# Patient Record
Sex: Female | Born: 1985 | Race: Black or African American | Hispanic: No | Marital: Single | Smoking: Current every day smoker
Health system: Southern US, Community
[De-identification: ages and names within clinical notes are randomized; demographics above are authoritative.]

## PROBLEM LIST (undated history)

## (undated) DIAGNOSIS — G43909 Migraine, unspecified, not intractable, without status migrainosus: Secondary | ICD-10-CM

## (undated) DIAGNOSIS — I10 Essential (primary) hypertension: Secondary | ICD-10-CM

## (undated) HISTORY — PX: ANKLE SURGERY: SHX546

## (undated) HISTORY — PX: OTHER SURGICAL HISTORY: SHX169

---

## 2004-05-06 ENCOUNTER — Other Ambulatory Visit: Payer: Self-pay

## 2004-07-30 ENCOUNTER — Emergency Department: Payer: Self-pay | Admitting: Emergency Medicine

## 2004-11-23 ENCOUNTER — Emergency Department: Payer: Self-pay | Admitting: Emergency Medicine

## 2005-02-01 ENCOUNTER — Emergency Department: Payer: Self-pay | Admitting: Unknown Physician Specialty

## 2005-12-11 ENCOUNTER — Emergency Department: Payer: Self-pay | Admitting: Emergency Medicine

## 2006-07-29 ENCOUNTER — Emergency Department: Payer: Self-pay | Admitting: Emergency Medicine

## 2007-01-20 ENCOUNTER — Emergency Department: Payer: Self-pay | Admitting: Emergency Medicine

## 2007-01-21 ENCOUNTER — Emergency Department: Payer: Self-pay

## 2007-05-20 ENCOUNTER — Emergency Department: Payer: Self-pay | Admitting: Emergency Medicine

## 2007-05-24 ENCOUNTER — Emergency Department: Payer: Self-pay | Admitting: Emergency Medicine

## 2007-06-10 ENCOUNTER — Emergency Department: Payer: Self-pay | Admitting: Internal Medicine

## 2007-06-10 ENCOUNTER — Emergency Department: Payer: Self-pay | Admitting: Emergency Medicine

## 2007-11-28 ENCOUNTER — Emergency Department: Payer: Self-pay | Admitting: Emergency Medicine

## 2008-04-19 ENCOUNTER — Emergency Department: Payer: Self-pay | Admitting: Emergency Medicine

## 2008-04-19 ENCOUNTER — Other Ambulatory Visit: Payer: Self-pay

## 2008-05-06 ENCOUNTER — Ambulatory Visit: Payer: Self-pay | Admitting: Family Medicine

## 2008-05-27 ENCOUNTER — Emergency Department: Payer: Self-pay | Admitting: Emergency Medicine

## 2008-07-17 ENCOUNTER — Other Ambulatory Visit: Payer: Self-pay

## 2008-07-17 ENCOUNTER — Emergency Department: Payer: Self-pay | Admitting: Emergency Medicine

## 2008-08-28 ENCOUNTER — Emergency Department: Payer: Self-pay | Admitting: Emergency Medicine

## 2008-09-02 ENCOUNTER — Emergency Department: Payer: Self-pay | Admitting: Internal Medicine

## 2008-11-12 ENCOUNTER — Emergency Department: Payer: Self-pay | Admitting: Emergency Medicine

## 2008-11-17 ENCOUNTER — Emergency Department: Payer: Self-pay | Admitting: Emergency Medicine

## 2009-02-03 ENCOUNTER — Emergency Department: Payer: Self-pay | Admitting: Emergency Medicine

## 2009-02-05 ENCOUNTER — Ambulatory Visit: Payer: Self-pay | Admitting: Family Medicine

## 2009-03-07 ENCOUNTER — Emergency Department: Payer: Self-pay | Admitting: Emergency Medicine

## 2009-04-17 ENCOUNTER — Emergency Department: Payer: Self-pay | Admitting: Emergency Medicine

## 2009-05-18 ENCOUNTER — Emergency Department: Payer: Self-pay | Admitting: Emergency Medicine

## 2009-07-22 ENCOUNTER — Emergency Department: Payer: Self-pay | Admitting: Emergency Medicine

## 2009-07-25 ENCOUNTER — Emergency Department: Payer: Self-pay | Admitting: Emergency Medicine

## 2009-07-31 ENCOUNTER — Ambulatory Visit: Payer: Self-pay | Admitting: Family Medicine

## 2009-10-09 IMAGING — CR DG CHEST 1V PORT
1 series · 1 of 1 positions shown · non-contrast
Comparison: none

REASON FOR EXAM: pain
COMMENTS:

PROCEDURE:     DXR - DXR PORTABLE CHEST SINGLE VIEW  - May 18, 2009  [DATE]
RESULT:     Comparison is made to the prior exam of 09/02/2008. The lung
fields remain clear. The heart, mediastinal and osseous structures show no
significant abnormalities.

[view not recorded]
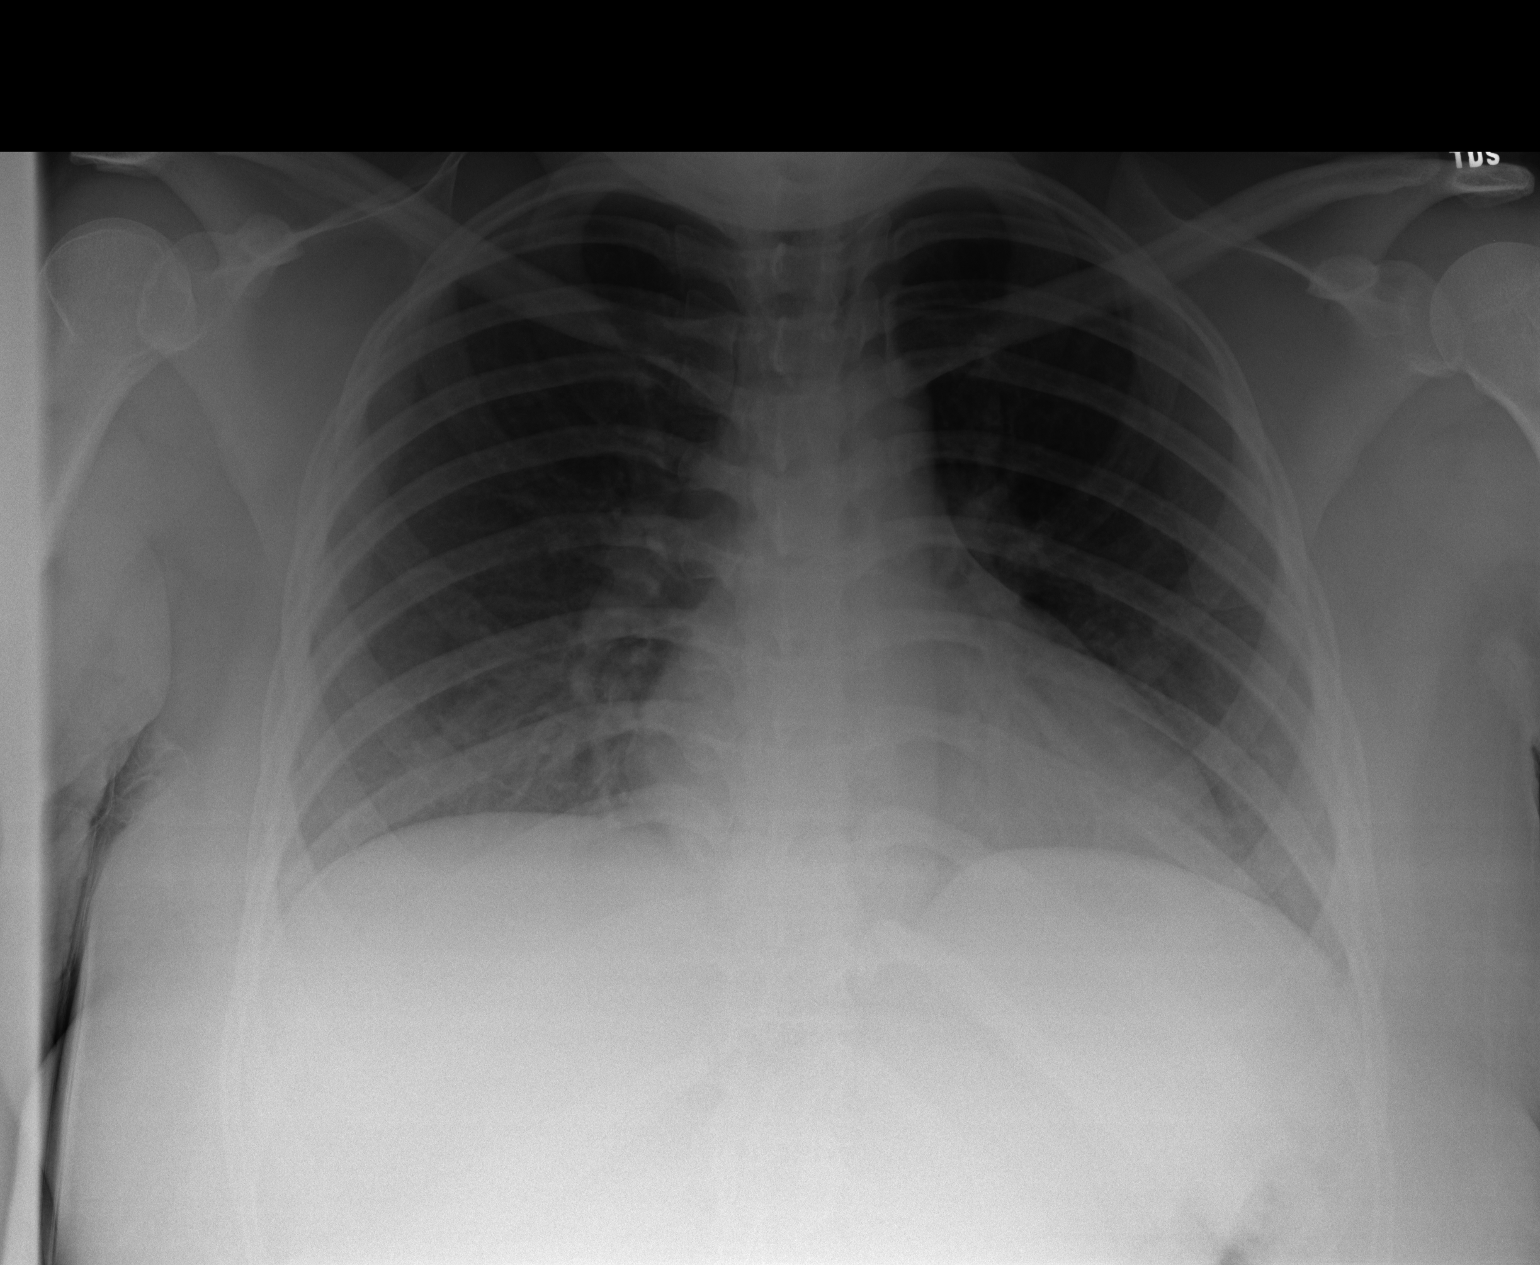

[1 of 1 positions shown; findings below may reference images not displayed]

IMPRESSION: 1.     No acute changes are identified.

## 2009-10-13 ENCOUNTER — Emergency Department: Payer: Self-pay | Admitting: Emergency Medicine

## 2010-01-09 ENCOUNTER — Emergency Department: Payer: Self-pay | Admitting: Emergency Medicine

## 2010-01-17 ENCOUNTER — Emergency Department: Payer: Self-pay | Admitting: Emergency Medicine

## 2010-04-08 ENCOUNTER — Emergency Department: Payer: Self-pay | Admitting: Emergency Medicine

## 2010-04-21 ENCOUNTER — Emergency Department: Payer: Self-pay | Admitting: Emergency Medicine

## 2010-06-20 ENCOUNTER — Emergency Department: Payer: Self-pay | Admitting: Emergency Medicine

## 2010-07-07 ENCOUNTER — Ambulatory Visit: Payer: Self-pay

## 2010-07-20 ENCOUNTER — Ambulatory Visit: Payer: Self-pay | Admitting: Unknown Physician Specialty

## 2010-07-21 ENCOUNTER — Emergency Department: Payer: Self-pay | Admitting: Emergency Medicine

## 2010-10-08 ENCOUNTER — Emergency Department: Payer: Self-pay | Admitting: Emergency Medicine

## 2011-02-15 ENCOUNTER — Emergency Department: Payer: Self-pay | Admitting: Emergency Medicine

## 2011-05-31 ENCOUNTER — Emergency Department: Payer: Self-pay | Admitting: Internal Medicine

## 2011-08-24 ENCOUNTER — Emergency Department: Payer: Self-pay | Admitting: Unknown Physician Specialty

## 2011-09-10 ENCOUNTER — Emergency Department: Payer: Self-pay | Admitting: Emergency Medicine

## 2011-10-08 ENCOUNTER — Emergency Department: Payer: Self-pay | Admitting: Emergency Medicine

## 2011-12-01 ENCOUNTER — Emergency Department: Payer: Self-pay | Admitting: Emergency Medicine

## 2011-12-06 ENCOUNTER — Emergency Department: Payer: Self-pay | Admitting: Unknown Physician Specialty

## 2011-12-06 LAB — COMPREHENSIVE METABOLIC PANEL
Albumin: 3.9 g/dL (ref 3.4–5.0)
Anion Gap: 11 (ref 7–16)
BUN: 9 mg/dL (ref 7–18)
Calcium, Total: 8.7 mg/dL (ref 8.5–10.1)
Chloride: 105 mmol/L (ref 98–107)
Co2: 26 mmol/L (ref 21–32)
EGFR (Non-African Amer.): >60
Glucose: 81 mg/dL (ref 65–99)
Osmolality: 281 (ref 275–301)
Potassium: 3.9 mmol/L (ref 3.5–5.1)
Sodium: 142 mmol/L (ref 136–145)
Total Protein: 7.6 g/dL (ref 6.4–8.2)

## 2011-12-06 LAB — URINALYSIS, COMPLETE
Bacteria: NONE SEEN
Glucose,UR: NEGATIVE mg/dL (ref 0–75)
Ketone: NEGATIVE
Leukocyte Esterase: NEGATIVE
Nitrite: NEGATIVE
Ph: 6 (ref 4.5–8.0)
Protein: NEGATIVE
RBC,UR: 1 /HPF (ref 0–5)
Specific Gravity: 1.02 (ref 1.003–1.030)

## 2011-12-06 LAB — DRUG SCREEN, URINE
Barbiturates, Ur Screen: NEGATIVE (ref ?–200)
Benzodiazepine, Ur Scrn: NEGATIVE (ref ?–200)
Cannabinoid 50 Ng, Ur ~~LOC~~: NEGATIVE (ref ?–50)
Cocaine Metabolite,Ur ~~LOC~~: NEGATIVE (ref ?–300)
MDMA (Ecstasy)Ur Screen: NEGATIVE (ref ?–500)
Opiate, Ur Screen: NEGATIVE (ref ?–300)
Phencyclidine (PCP) Ur S: NEGATIVE (ref ?–25)

## 2011-12-06 LAB — CBC
HCT: 42.6 % (ref 35.0–47.0)
MCH: 32.3 pg (ref 26.0–34.0)
MCHC: 33.5 g/dL (ref 32.0–36.0)
RDW: 13.1 % (ref 11.5–14.5)

## 2011-12-06 LAB — PREGNANCY, URINE: Pregnancy Test, Urine: NEGATIVE m[IU]/mL

## 2011-12-25 ENCOUNTER — Emergency Department: Payer: Self-pay | Admitting: Emergency Medicine

## 2011-12-25 LAB — URINALYSIS, COMPLETE
Bacteria: NONE SEEN
Glucose,UR: NEGATIVE mg/dL (ref 0–75)
Nitrite: NEGATIVE
Protein: NEGATIVE
RBC,UR: 4 /HPF (ref 0–5)
WBC UR: 15 /HPF (ref 0–5)

## 2012-02-10 ENCOUNTER — Emergency Department: Payer: Self-pay | Admitting: Emergency Medicine

## 2012-02-10 LAB — URINALYSIS, COMPLETE
Blood: NEGATIVE
Glucose,UR: NEGATIVE mg/dL (ref 0–75)
Ketone: NEGATIVE
Leukocyte Esterase: NEGATIVE
Nitrite: NEGATIVE
Ph: 5 (ref 4.5–8.0)
Protein: NEGATIVE
RBC,UR: NONE SEEN /HPF (ref 0–5)
Specific Gravity: 1.024 (ref 1.003–1.030)
Squamous Epithelial: 4

## 2012-02-23 ENCOUNTER — Ambulatory Visit: Payer: Self-pay | Admitting: Family Medicine

## 2012-03-02 ENCOUNTER — Emergency Department: Payer: Self-pay | Admitting: Emergency Medicine

## 2012-03-02 LAB — COMPREHENSIVE METABOLIC PANEL
Albumin: 3.9 g/dL (ref 3.4–5.0)
Alkaline Phosphatase: 81 U/L (ref 50–136)
Anion Gap: 9 (ref 7–16)
BUN: 10 mg/dL (ref 7–18)
Calcium, Total: 8.6 mg/dL (ref 8.5–10.1)
Chloride: 107 mmol/L (ref 98–107)
Co2: 25 mmol/L (ref 21–32)
Creatinine: 0.84 mg/dL (ref 0.60–1.30)
EGFR (African American): >60
Osmolality: 281 (ref 275–301)
Potassium: 3.7 mmol/L (ref 3.5–5.1)
SGOT(AST): 23 U/L (ref 15–37)
SGPT (ALT): 20 U/L

## 2012-03-02 LAB — URINALYSIS, COMPLETE
Bilirubin,UR: NEGATIVE
Glucose,UR: NEGATIVE mg/dL (ref 0–75)
Leukocyte Esterase: NEGATIVE
Nitrite: NEGATIVE
Protein: NEGATIVE
Specific Gravity: 1.029 (ref 1.003–1.030)
Squamous Epithelial: 5

## 2012-03-02 LAB — CBC
HCT: 44.3 % (ref 35.0–47.0)
MCH: 31.7 pg (ref 26.0–34.0)
MCHC: 32.7 g/dL (ref 32.0–36.0)
Platelet: 194 10*3/uL (ref 150–440)
RBC: 4.57 10*6/uL (ref 3.80–5.20)
WBC: 9.5 10*3/uL (ref 3.6–11.0)

## 2012-03-24 ENCOUNTER — Emergency Department: Payer: Self-pay | Admitting: Internal Medicine

## 2012-03-24 LAB — URINALYSIS, COMPLETE
Bacteria: NONE SEEN
Glucose,UR: NEGATIVE mg/dL (ref 0–75)
Ketone: NEGATIVE
Leukocyte Esterase: NEGATIVE
Nitrite: NEGATIVE
Specific Gravity: 1.013 (ref 1.003–1.030)
WBC UR: NONE SEEN /HPF (ref 0–5)

## 2012-07-02 ENCOUNTER — Emergency Department: Payer: Self-pay | Admitting: Emergency Medicine

## 2012-12-27 ENCOUNTER — Emergency Department: Payer: Self-pay | Admitting: Emergency Medicine

## 2012-12-27 LAB — URINALYSIS, COMPLETE
Bilirubin,UR: NEGATIVE
Blood: NEGATIVE
Glucose,UR: NEGATIVE mg/dL (ref 0–75)
Ketone: NEGATIVE
Nitrite: NEGATIVE
Protein: NEGATIVE
RBC,UR: 3 /HPF (ref 0–5)
Specific Gravity: 1.026 (ref 1.003–1.030)
Squamous Epithelial: 4
WBC UR: 6 /HPF (ref 0–5)

## 2012-12-27 LAB — CBC
MCHC: 32.8 g/dL (ref 32.0–36.0)
Platelet: 182 10*3/uL (ref 150–440)

## 2012-12-27 LAB — COMPREHENSIVE METABOLIC PANEL
Alkaline Phosphatase: 90 U/L (ref 50–136)
Anion Gap: 5 — ABNORMAL LOW (ref 7–16)
BUN: 9 mg/dL (ref 7–18)
Calcium, Total: 8 mg/dL — ABNORMAL LOW (ref 8.5–10.1)
Co2: 26 mmol/L (ref 21–32)
Creatinine: 0.82 mg/dL (ref 0.60–1.30)
EGFR (African American): >60
Osmolality: 281 (ref 275–301)
Potassium: 3.8 mmol/L (ref 3.5–5.1)
SGPT (ALT): 24 U/L (ref 12–78)
Sodium: 142 mmol/L (ref 136–145)
Total Protein: 7.5 g/dL (ref 6.4–8.2)

## 2012-12-27 LAB — TROPONIN I: Troponin-I: <0.02 ng/mL

## 2012-12-27 LAB — LIPASE, BLOOD: Lipase: 147 U/L (ref 73–393)

## 2013-04-10 ENCOUNTER — Ambulatory Visit: Payer: Self-pay | Admitting: Pain Medicine

## 2013-04-18 ENCOUNTER — Other Ambulatory Visit: Payer: Self-pay | Admitting: Pain Medicine

## 2013-04-18 ENCOUNTER — Ambulatory Visit: Payer: Self-pay | Admitting: Pain Medicine

## 2013-04-18 LAB — MAGNESIUM: Magnesium: 2.2 mg/dL

## 2013-11-28 ENCOUNTER — Emergency Department: Payer: Self-pay | Admitting: Internal Medicine

## 2014-10-06 ENCOUNTER — Emergency Department: Payer: Self-pay | Admitting: Emergency Medicine

## 2014-10-08 DIAGNOSIS — S82842A Displaced bimalleolar fracture of left lower leg, initial encounter for closed fracture: Secondary | ICD-10-CM | POA: Insufficient documentation

## 2014-10-09 ENCOUNTER — Ambulatory Visit: Payer: Self-pay | Admitting: Orthopedic Surgery

## 2014-10-11 ENCOUNTER — Emergency Department: Payer: Self-pay | Admitting: Emergency Medicine

## 2014-10-11 LAB — BASIC METABOLIC PANEL
ANION GAP: 10 (ref 7–16)
BUN: 8 mg/dL (ref 7–18)
CHLORIDE: 107 mmol/L (ref 98–107)
CREATININE: 0.88 mg/dL (ref 0.60–1.30)
Calcium, Total: 8.1 mg/dL — ABNORMAL LOW (ref 8.5–10.1)
Co2: 23 mmol/L (ref 21–32)
EGFR (African American): >60
EGFR (Non-African Amer.): >60
Glucose: 76 mg/dL (ref 65–99)
Osmolality: 276 (ref 275–301)
Potassium: 4 mmol/L (ref 3.5–5.1)
Sodium: 140 mmol/L (ref 136–145)

## 2014-10-11 LAB — CBC WITH DIFFERENTIAL/PLATELET
BASOS PCT: 0.9 %
Basophil #: 0.2 10*3/uL — ABNORMAL HIGH (ref 0.0–0.1)
Eosinophil #: 0.2 10*3/uL (ref 0.0–0.7)
Eosinophil %: 1 %
HCT: 43.6 % (ref 35.0–47.0)
HGB: 14.6 g/dL (ref 12.0–16.0)
Lymphocyte #: 7.8 10*3/uL — ABNORMAL HIGH (ref 1.0–3.6)
Lymphocyte %: 42.1 %
MCH: 32.4 pg (ref 26.0–34.0)
MCHC: 33.5 g/dL (ref 32.0–36.0)
MCV: 97 fL (ref 80–100)
MONO ABS: 0.8 x10 3/mm (ref 0.2–0.9)
Monocyte %: 4.3 %
NEUTROS PCT: 51.7 %
Neutrophil #: 9.6 10*3/uL — ABNORMAL HIGH (ref 1.4–6.5)
Platelet: 229 10*3/uL (ref 150–440)
RBC: 4.52 10*6/uL (ref 3.80–5.20)
RDW: 14.3 % (ref 11.5–14.5)
WBC: 18.6 10*3/uL — ABNORMAL HIGH (ref 3.6–11.0)

## 2014-10-11 LAB — URINALYSIS, COMPLETE
BLOOD: NEGATIVE
Bacteria: NONE SEEN
Bilirubin,UR: NEGATIVE
Glucose,UR: NEGATIVE mg/dL (ref 0–75)
Ketone: NEGATIVE
Nitrite: NEGATIVE
PROTEIN: NEGATIVE
Ph: 6 (ref 4.5–8.0)
RBC,UR: 2 /HPF (ref 0–5)
SPECIFIC GRAVITY: 1.017 (ref 1.003–1.030)
Squamous Epithelial: 4
WBC UR: 2 /HPF (ref 0–5)

## 2014-10-13 LAB — URINE CULTURE

## 2015-02-04 NOTE — Op Note (Signed)
PATIENT NAME:  Rebekah DillsBOSWELL, Lenzi R MR#:  [id number] DATE OF BIRTH:  1986/03/05  DATE OF PROCEDURE:  10/09/2014  PREOPERATIVE DIAGNOSIS:  Left bimalleolar ankle fracture, displaced.   POSTOPERATIVE DIAGNOSIS:  Left bimalleolar ankle fracture, displaced.   PROCEDURE:  Open reduction and internal fixation, left bimalleolar ankle fracture.   ANESTHESIA:  General.   SURGEON:  Leitha SchullerMichael J. Samuell Knoble, MD   DESCRIPTION OF PROCEDURE:  The patient was brought to the operating room, and after adequate anesthesia was obtained, the left leg was prepped and draped in the usual sterile fashion with a bump underneath the left buttock to internally rotate the leg. After prepping and draping in the usual sterile fashion, appropriate patient identification and timeout procedures were completed. The tourniquet was raised to 300 mmHg. A lateral approach was made to the distal fibula. The incision was carried down through the skin and subcutaneous tissue. The fracture site was exposed and reduced with a reduction clamp. An anterior-to-posterior lag screw was inserted, first drilling with a 3.5 drill, then placing the soft tissue protector into the lag screw hole and drilling the posterior cortex with a 2.5 drill. This was measured and a cortical screw inserted. The clamp was removed, and the fracture alignment was maintained with good compression of the fracture site. A 6-hole one-third tubular plate was contoured to fit the distal fibula for neutralization with 3 proximal cortical screws and 2 distal fully-threaded cancellous screws. Mini C-arm was used to make sure the position of the hardware was appropriate and it was. Next, the wound was thoroughly irrigated and closed with 2-0 Vicryl subcutaneously and skin staples. Attention was then turned medially, where an anteromedial approach was made and the fracture site exposed, the joint irrigated, and there was no obvious debris within the joint. The fracture was held in a reduced  position with a dental pick, and a single guidewire was inserted perpendicular to the fracture line from the 4.0 partially threaded Synthes cannulated screw set. The cortex was drilled, and a 34 mm partially-threaded cancellous screw was inserted with good compression of the fracture. Under stress views, there was no opening of the mortise. The mortise appeared intact with appropriate syndesmosis. The wound was irrigated and the wound closed again with 2-0 Vicryl subcutaneously and skin staples. Xeroform, 4 x 4's, Webril, and a well-padded cast were applied. The tourniquet was let down.   TOURNIQUET TIME:  39 minutes at 300 mmHg.   COMPLICATIONS:  None.   SPECIMEN:  None.   IMPLANTS:  Synthes 3.5 mm small one-third tubular plate and screws, 4.0 cannulated screw medially.    ____________________________ Leitha SchullerMichael J. Rozella Servello, MD mjm:nb D: 10/09/2014 19:19:48 ET T: 10/09/2014 23:33:43 ET JOB#: [id number]  cc: Leitha SchullerMichael J. Yovany Clock, MD, <Dictator> Leitha SchullerMICHAEL J Locklan Canoy MD ELECTRONICALLY SIGNED 10/14/2014 7:23

## 2015-07-03 ENCOUNTER — Encounter: Payer: Self-pay | Admitting: Emergency Medicine

## 2015-07-03 ENCOUNTER — Emergency Department
Admission: EM | Admit: 2015-07-03 | Discharge: 2015-07-03 | Disposition: A | Discharge status: Home / Self Care | Payer: Medicaid Other | Attending: Emergency Medicine | Admitting: Emergency Medicine

## 2015-07-03 DIAGNOSIS — R51 Headache: Secondary | ICD-10-CM | POA: Insufficient documentation

## 2015-07-03 DIAGNOSIS — R519 Headache, unspecified: Secondary | ICD-10-CM

## 2015-07-03 DIAGNOSIS — R110 Nausea: Secondary | ICD-10-CM

## 2015-07-03 DIAGNOSIS — Z720 Tobacco use: Secondary | ICD-10-CM | POA: Insufficient documentation

## 2015-07-03 DIAGNOSIS — R112 Nausea with vomiting, unspecified: Secondary | ICD-10-CM | POA: Insufficient documentation

## 2015-07-03 HISTORY — DX: Migraine, unspecified, not intractable, without status migrainosus: G43.909

## 2015-07-03 LAB — CBC WITH DIFFERENTIAL/PLATELET
BASOS ABS: 0.1 10*3/uL (ref 0–0.1)
BASOS PCT: 1 %
EOS PCT: 1 %
Eosinophils Absolute: 0.1 10*3/uL (ref 0–0.7)
HCT: 46 % (ref 35.0–47.0)
Hemoglobin: 15.1 g/dL (ref 12.0–16.0)
Lymphocytes Relative: 28 %
Lymphs Abs: 2.7 10*3/uL (ref 1.0–3.6)
MCH: 30.9 pg (ref 26.0–34.0)
MCHC: 32.7 g/dL (ref 32.0–36.0)
MCV: 94.3 fL (ref 80.0–100.0)
MONO ABS: 0.4 10*3/uL (ref 0.2–0.9)
Monocytes Relative: 4 %
Neutro Abs: 6.5 10*3/uL (ref 1.4–6.5)
Neutrophils Relative %: 66 %
PLATELETS: 208 10*3/uL (ref 150–440)
RBC: 4.87 MIL/uL (ref 3.80–5.20)
RDW: 14.1 % (ref 11.5–14.5)
WBC: 9.8 10*3/uL (ref 3.6–11.0)

## 2015-07-03 LAB — COMPREHENSIVE METABOLIC PANEL
ALBUMIN: 4.3 g/dL (ref 3.5–5.0)
ALT: 14 U/L (ref 14–54)
AST: 22 U/L (ref 15–41)
Alkaline Phosphatase: 81 U/L (ref 38–126)
Anion gap: 9 (ref 5–15)
BUN: 11 mg/dL (ref 6–20)
CHLORIDE: 107 mmol/L (ref 101–111)
CO2: 22 mmol/L (ref 22–32)
Calcium: 8.7 mg/dL — ABNORMAL LOW (ref 8.9–10.3)
Creatinine, Ser: 0.9 mg/dL (ref 0.44–1.00)
GFR calc Af Amer: >60 mL/min (ref >60)
Glucose, Bld: 89 mg/dL (ref 65–99)
POTASSIUM: 4 mmol/L (ref 3.5–5.1)
Sodium: 138 mmol/L (ref 135–145)
Total Bilirubin: 0.4 mg/dL (ref 0.3–1.2)
Total Protein: 7.8 g/dL (ref 6.5–8.1)

## 2015-07-03 LAB — LIPASE, BLOOD: LIPASE: 27 U/L (ref 22–51)

## 2015-07-03 MED ORDER — PROCHLORPERAZINE EDISYLATE 5 MG/ML IJ SOLN
10.0000 mg | Freq: Four times a day (QID) | INTRAMUSCULAR | Status: DC | PRN
  Administered 2015-07-03: 10 mg via INTRAVENOUS
  Filled 2015-07-03: qty 2

## 2015-07-03 MED ORDER — PROCHLORPERAZINE MALEATE 10 MG PO TABS: 10.0000 mg | ORAL_TABLET | Freq: Four times a day (QID) | ORAL | Status: DC | PRN

## 2015-07-03 MED ORDER — SODIUM CHLORIDE 0.9 % IV BOLUS (SEPSIS)
1000.0000 mL | Freq: Once | INTRAVENOUS | Status: AC
Start: 2015-07-03 — End: 2015-07-03
  Administered 2015-07-03: 1000 mL via INTRAVENOUS

## 2015-07-03 NOTE — ED Provider Notes (Signed)
[facility] Emergency Department Provider Note    ____________________________________________  Time seen: 1205  I have reviewed the triage vital signs and the nursing notes.   HISTORY  Chief Complaint Headache   History limited by: Not Limited   HPI Rebekah Thomas is a 29 y.o. female who presents with multiple medical complaints. The patient states she has felt unwell for the past week. It has been constant. She states that she has felt weak over the past week. In addition she has had a headache. She states that it is global. Has been accompanied by some blurry vision. She states she has been checking her blood pressure at a pharmacy and it has been consistently elevated in the past week. Patient states that yesterday she started having some nausea and vomiting. No blood in the vomiting. No diarrhea. No fevers.  Past Medical History  Diagnosis Date  . Migraine     There are no active problems to display for this patient.   Past Surgical History  Procedure Laterality Date  . Ankle surgery      No current outpatient prescriptions on file.  Allergies Review of patient's allergies indicates not on file.  History reviewed. No pertinent family history.  Social History Social History  Substance Use Topics  . Smoking status: Current Every Day Smoker  . Smokeless tobacco: None  . Alcohol Use: None    Review of Systems  Constitutional: Negative for fever. Cardiovascular: Negative for chest pain. Respiratory: Negative for shortness of breath. Gastrointestinal: Negative for abdominal pain, vomiting and diarrhea. Genitourinary: Negative for dysuria. Musculoskeletal: Negative for back pain. Skin: Negative for rash. Neurological: Positive for headache. No focal weakness.  10-point ROS otherwise negative.  ____________________________________________   PHYSICAL EXAM:  VITAL SIGNS: ED Triage Vitals  Enc Vitals Group     BP 07/03/15 1009  146/78 mmHg     Pulse Rate 07/03/15 1009 88     Resp 07/03/15 1009 18     Temp 07/03/15 1009 98.6 F (37 C)     Temp Source 07/03/15 1009 Oral     SpO2 07/03/15 1009 98 %     Weight 07/03/15 1009 295 lb (133.811 kg)     Height 07/03/15 1009  (1.651 m)     Head Cir --      Peak Flow --      Pain Score 07/03/15 1009 8   Constitutional: Alert and oriented. Well appearing and in no distress. Eyes: Conjunctivae are normal. PERRL. Normal extraocular movements. ENT   Head: Normocephalic and atraumatic.   Nose: No congestion/rhinnorhea.   Mouth/Throat: Mucous membranes are moist.   Neck: No stridor. Hematological/Lymphatic/Immunilogical: No cervical lymphadenopathy. Cardiovascular: Normal rate, regular rhythm.  No murmurs, rubs, or gallops. Respiratory: Normal respiratory effort without tachypnea nor retractions. Breath sounds are clear and equal bilaterally. No wheezes/rales/rhonchi. Gastrointestinal: Soft and nontender. No distention. There is no CVA tenderness. Genitourinary: Deferred Musculoskeletal: Normal range of motion in all extremities. No joint effusions.  No lower extremity tenderness nor edema. Neurologic:  Normal speech and language. Face symmetric. Symmetrical bilateral palate elevation. Tongue midline. Strength 5 out of 5 in upper and lower extremities. Sensation grossly intact. No gross focal neurologic deficits are appreciated.  Skin:  Skin is warm, dry and intact. No rash noted. Psychiatric: Mood and affect are normal. Speech and behavior are normal. Patient exhibits appropriate insight and judgment.  ____________________________________________    LABS (pertinent positives/negatives)  Labs Reviewed  COMPREHENSIVE METABOLIC PANEL - Abnormal;  Notable for the following:    Calcium 8.7 (*)    All other components within normal limits  CBC WITH DIFFERENTIAL/PLATELET  LIPASE, BLOOD  URINALYSIS COMPLETEWITH MICROSCOPIC ([facility] ONLY)      ____________________________________________   EKG  None  ____________________________________________    RADIOLOGY  None  ____________________________________________   PROCEDURES  Procedure(s) performed: None  Critical Care performed: No  ____________________________________________   INITIAL IMPRESSION / ASSESSMENT AND PLAN / ED COURSE  Pertinent labs & imaging results that were available during my care of the patient were reviewed by me and considered in my medical decision making (see chart for details).  Patient here with headache and nausea. No focal neuro deficits on exam. Patient felt much better after medication and fluids. Desired to be discharged home.   ____________________________________________   FINAL CLINICAL IMPRESSION(S) / ED DIAGNOSES  Final diagnoses:  Headache, unspecified headache type  Nausea     Phineas Semen, MD 07/03/15 1409

## 2015-07-03 NOTE — Discharge Instructions (Signed)
Please seek medical attention for any high fevers, chest pain, shortness of breath, change in behavior, persistent vomiting, bloody stool or any other new or concerning symptoms. ° °General Headache Without Cause °A headache is pain or discomfort felt around the head or neck area. The specific cause of a headache may not be found. There are many causes and types of headaches. A few common ones are: °· Tension headaches. °· Migraine headaches. °· Cluster headaches. °· Chronic daily headaches. °HOME CARE INSTRUCTIONS  °· Keep all follow-up appointments with your caregiver or any specialist referral. °· Only take over-the-counter or prescription medicines for pain or discomfort as directed by your caregiver. °· Lie down in a dark, quiet room when you have a headache. °· Keep a headache journal to find out what may trigger your migraine headaches. For example, write down: °¨ What you eat and drink. °¨ How much sleep you get. °¨ Any change to your diet or medicines. °· Try massage or other relaxation techniques. °· Put ice packs or heat on the head and neck. Use these 3 to 4 times per day for 15 to 20 minutes each time, or as needed. °· Limit stress. °· Sit up straight, and do not tense your muscles. °· Quit smoking if you smoke. °· Limit alcohol use. °· Decrease the amount of caffeine you drink, or stop drinking caffeine. °· Eat and sleep on a regular schedule. °· Get 7 to 9 hours of sleep, or as recommended by your caregiver. °· Keep lights dim if bright lights bother you and make your headaches worse. °SEEK MEDICAL CARE IF:  °· You have problems with the medicines you were prescribed. °· Your medicines are not working. °· You have a change from the usual headache. °· You have nausea or vomiting. °SEEK IMMEDIATE MEDICAL CARE IF:  °· Your headache becomes severe. °· You have a fever. °· You have a stiff neck. °· You have loss of vision. °· You have muscular weakness or loss of muscle control. °· You start losing your  balance or have trouble walking. °· You feel faint or pass out. °· You have severe symptoms that are different from your first symptoms. °MAKE SURE YOU:  °· Understand these instructions. °· Will watch your condition. °· Will get help right away if you are not doing well or get worse. °Document Released: 09/26/2005 Document Revised: 12/19/2011 Document Reviewed: 10/12/2011 °[employer]® Patient Information ©2015 [employer]. This information is not intended to replace advice given to you by your health care provider. Make sure you discuss any questions you have with your health care provider. ° °

## 2015-07-03 NOTE — ED Notes (Signed)
Reports ha, nausea and htn x 1 wk.

## 2016-08-09 ENCOUNTER — Ambulatory Visit
Admission: RE | Admit: 2016-08-09 | Discharge: 2016-08-09 | Disposition: A | Discharge status: Home / Self Care | Payer: Medicaid Other | Source: Ambulatory Visit | Attending: Family Medicine | Admitting: Family Medicine

## 2016-08-09 ENCOUNTER — Other Ambulatory Visit: Payer: Self-pay | Admitting: Family Medicine

## 2016-08-09 DIAGNOSIS — M160 Bilateral primary osteoarthritis of hip: Secondary | ICD-10-CM | POA: Insufficient documentation

## 2016-08-09 DIAGNOSIS — M5136 Other intervertebral disc degeneration, lumbar region: Secondary | ICD-10-CM | POA: Insufficient documentation

## 2016-08-09 DIAGNOSIS — M25552 Pain in left hip: Secondary | ICD-10-CM

## 2016-08-14 ENCOUNTER — Encounter: Payer: Self-pay | Admitting: Emergency Medicine

## 2016-08-14 ENCOUNTER — Emergency Department
Admission: EM | Admit: 2016-08-14 | Discharge: 2016-08-14 | Disposition: A | Discharge status: Home / Self Care | Payer: Medicaid Other | Attending: Emergency Medicine | Admitting: Emergency Medicine

## 2016-08-14 DIAGNOSIS — M25551 Pain in right hip: Secondary | ICD-10-CM | POA: Insufficient documentation

## 2016-08-14 DIAGNOSIS — M545 Low back pain, unspecified: Secondary | ICD-10-CM

## 2016-08-14 DIAGNOSIS — M25552 Pain in left hip: Secondary | ICD-10-CM | POA: Insufficient documentation

## 2016-08-14 DIAGNOSIS — F17200 Nicotine dependence, unspecified, uncomplicated: Secondary | ICD-10-CM | POA: Insufficient documentation

## 2016-08-14 MED ORDER — MELOXICAM 15 MG PO TABS: 15.0000 mg | ORAL_TABLET | Freq: Every day | ORAL | 2 refills | Status: AC

## 2016-08-14 NOTE — ED Triage Notes (Signed)
Pt reports low back pain that shoots down both legs for approximately three weeks. Denies injury. Pt reports saw PCP and diagnosed with arthritis but not given any medications.

## 2016-08-14 NOTE — ED Notes (Signed)
Patient seen by [facility] on the 31st for low back pain. Diagnosed with "arthritis". Patient c/o bilateral low back pain with radiation into both hips. Denies trauma, denies urinary or bowel complications. Ambulates without difficulty.

## 2016-08-14 NOTE — ED Provider Notes (Signed)
[facility] Emergency Department Provider Note  ____________________________________________   First MD Initiated Contact with Patient 08/14/16 [phone]     (approximate)  I have reviewed the triage vital signs and the nursing notes.   HISTORY  Chief Complaint Back Pain   HPI Rebekah Thomas is a 30 y.o. female is here with complaint of low back pain. Patient states that this is been going on for approximately 3 weeks. She states that she was seen by her primary care at [facility] on October 31 and diagnosed with "arthritis". Patient states she has low back pain that radiates into both hips and is worse with walking or standing. She denies any trauma. She denies any incontinence of bowel or bladder. Patient continues to ambulate unassisted. Patient states that pain is worse in the mornings. She states her PCP after diagnosing her with arthritis did not give her a prescription for anything. She was offered steroids but declined. Patient has been taking Tylenol arthritis at home without any improvement.  Past Medical History:  Diagnosis Date  . Migraine     There are no active problems to display for this patient.   Past Surgical History:  Procedure Laterality Date  . ANKLE SURGERY    . knee surgery      Prior to Admission medications   Medication Sig Start Date End Date Taking? Authorizing Provider  meloxicam (MOBIC) 15 MG tablet Take 1 tablet (15 mg total) by mouth daily. 08/14/16 08/14/17  Tommi Rumpshonda L Summers, PA-C  prochlorperazine (COMPAZINE) 10 MG tablet Take 1 tablet (10 mg total) by mouth every 6 (six) hours as needed for nausea (headache). 07/03/15 07/02/16  Phineas SemenGraydon Goodman, MD    Allergies Dilaudid [hydromorphone]; Aspirin; Penicillins; and Sulfa antibiotics  No family history on file.  Social History Social History  Substance Use Topics  . Smoking status: Current Every Day Smoker  . Smokeless tobacco: Not on file  . Alcohol use Not on file      Review of Systems Constitutional: No fever/chills Eyes: No visual changes. ENT: No sore throat. Cardiovascular: Denies chest pain. Respiratory: Denies shortness of breath. Gastrointestinal: No abdominal pain.  No nausea, no vomiting.   Genitourinary: Negative for dysuria. Musculoskeletal: Positive for low back pain. Positive for bilateral hip pain. Skin: Negative for rash. Neurological: Negative for headaches, focal weakness or numbness.  10-point ROS otherwise negative.  ____________________________________________   PHYSICAL EXAM:  VITAL SIGNS: ED Triage Vitals [08/14/16 0727]  Enc Vitals Group     BP (!) 166/98     Pulse Rate 89     Resp 18     Temp 98 F (36.7 C)     Temp Source Oral     SpO2 98 %     Weight 258 lb (117 kg)     Height 5\' 5"  (1.651 m)     Head Circumference      Peak Flow      Pain Score 8     Pain Loc      Pain Edu?      Excl. in GC?     Constitutional: Alert and oriented. Well appearing and in no acute distress. Moderately obese. Eyes: Conjunctivae are normal. PERRL. EOMI. Head: Atraumatic. Nose: No congestion/rhinnorhea. Neck: No stridor.   Cardiovascular: Normal rate, regular rhythm. Grossly normal heart sounds.  Good peripheral circulation. Respiratory: Normal respiratory effort.  No retractions. Lungs CTAB. Gastrointestinal: Soft and nontender. No distention. No CVA tenderness. Musculoskeletal: Examination of the back there  is no gross deformity noted. There is point tenderness on palpation of the spinous processes L5-S1 area. There is some tenderness on palpation of the SI joints bilaterally. Range of motion of the lower extremities is without restriction. Straight leg raises are 90 bilaterally with back discomfort. Good muscle strength. Neurologic:  Normal speech and language. No gross focal neurologic deficits are appreciated. No gait instability. Reflexes 2+ bilaterally. Skin:  Skin is warm, dry and intact. No rash  noted. Psychiatric: Mood and affect are normal. Speech and behavior are normal.  ____________________________________________   LABS (all labs ordered are listed, but only abnormal results are displayed)  Labs Reviewed - No data to display  PROCEDURES  Procedure(s) performed: None  Procedures  Critical Care performed: No  ____________________________________________   INITIAL IMPRESSION / ASSESSMENT AND PLAN / ED COURSE  Pertinent labs & imaging results that were available during my care of the patient were reviewed by me and considered in my medical decision making (see chart for details).    Clinical Course    Patient states that she had a rash with aspirin but denies any respiratory difficulties. Patient has not tried Aleve or other anti-inflammatories. Patient is willing to try meloxicam 15 mg. She is aware that if she begins with rash to stop taking the medication immediately and take Benadryl. She is return to the emergency room if any severe respiratory difficulties. She is also to follow-up with her primary care next week if any continued problems.  ____________________________________________   FINAL CLINICAL IMPRESSION(S) / ED DIAGNOSES  Final diagnoses:  Acute midline low back pain without sciatica      NEW MEDICATIONS STARTED DURING THIS VISIT:  Discharge Medication List as of 08/14/2016  7:53 AM    START taking these medications   Details  meloxicam (MOBIC) 15 MG tablet Take 1 tablet (15 mg total) by mouth daily., Starting Sun 08/14/2016, Until Mon 08/14/2017, Print         Note:  This document was prepared using Dragon voice recognition software and may include unintentional dictation errors.    Tommi Rumpshonda L Summers, PA-C 08/14/16 1457    Minna AntisKevin Paduchowski, MD 08/14/16 1530

## 2016-09-26 ENCOUNTER — Encounter: Payer: Self-pay | Admitting: Emergency Medicine

## 2016-09-26 ENCOUNTER — Emergency Department
Admission: EM | Admit: 2016-09-26 | Discharge: 2016-09-26 | Disposition: A | Discharge status: Home / Self Care | Payer: Medicaid Other | Attending: Emergency Medicine | Admitting: Emergency Medicine

## 2016-09-26 DIAGNOSIS — J111 Influenza due to unidentified influenza virus with other respiratory manifestations: Secondary | ICD-10-CM | POA: Insufficient documentation

## 2016-09-26 DIAGNOSIS — I10 Essential (primary) hypertension: Secondary | ICD-10-CM | POA: Insufficient documentation

## 2016-09-26 DIAGNOSIS — F17200 Nicotine dependence, unspecified, uncomplicated: Secondary | ICD-10-CM | POA: Insufficient documentation

## 2016-09-26 DIAGNOSIS — Z79899 Other long term (current) drug therapy: Secondary | ICD-10-CM | POA: Insufficient documentation

## 2016-09-26 LAB — INFLUENZA PANEL BY PCR (TYPE A & B)
INFLAPCR: POSITIVE — AB
INFLBPCR: NEGATIVE

## 2016-09-26 MED ORDER — IBUPROFEN 800 MG PO TABS
800.0000 mg | ORAL_TABLET | Freq: Once | ORAL | Status: AC
  Administered 2016-09-26: 800 mg via ORAL
  Filled 2016-09-26: qty 1

## 2016-09-26 MED ORDER — OSELTAMIVIR PHOSPHATE 75 MG PO CAPS: 75.0000 mg | ORAL_CAPSULE | Freq: Two times a day (BID) | ORAL | 0 refills | Status: AC

## 2016-09-26 MED ORDER — TRAMADOL HCL 50 MG PO TABS: 50.0000 mg | ORAL_TABLET | Freq: Four times a day (QID) | ORAL | 0 refills | Status: AC | PRN

## 2016-09-26 MED ORDER — HYDROCOD POLST-CPM POLST ER 10-8 MG/5ML PO SUER
5.0000 mL | Freq: Once | ORAL | Status: AC
  Administered 2016-09-26: 5 mL via ORAL
  Filled 2016-09-26: qty 5

## 2016-09-26 MED ORDER — IBUPROFEN 800 MG PO TABS: 800.0000 mg | ORAL_TABLET | Freq: Three times a day (TID) | ORAL | 0 refills | Status: DC | PRN

## 2016-09-26 NOTE — ED Provider Notes (Signed)
[facility] Emergency Department Provider Note   ____________________________________________   First MD Initiated Contact with Patient 09/26/16 1747     (approximate)  I have reviewed the triage vital signs and the nursing notes.   HISTORY  Chief Complaint Generalized Body Aches    HPI Rebekah Thomas is a 30 y.o. female patient complaining of flulike symptoms for 2 days. Patient state body aches, sinus congestion, frontal headache, sore throat, and nonproductive cough. Patient state experiencing diarrhea but denies nausea vomiting. No palliative measures for these complaints. Patient states she has not taken a flu shot this season. Patient rates the pain as a 9/10. Patient described a pain as "achy".   Past Medical History:  Diagnosis Date  . Migraine     There are no active problems to display for this patient.   Past Surgical History:  Procedure Laterality Date  . ANKLE SURGERY    . knee surgery      Prior to Admission medications   Medication Sig Start Date End Date Taking? Authorizing Provider  ibuprofen (ADVIL,MOTRIN) 800 MG tablet Take 1 tablet (800 mg total) by mouth every 8 (eight) hours as needed for moderate pain. 09/26/16   Joni Reiningonald K Brityn Mastrogiovanni, PA-C  meloxicam (MOBIC) 15 MG tablet Take 1 tablet (15 mg total) by mouth daily. 08/14/16 08/14/17  Tommi Rumpshonda L Summers, PA-C  oseltamivir (TAMIFLU) 75 MG capsule Take 1 capsule (75 mg total) by mouth 2 (two) times daily. 09/26/16 10/01/16  Joni Reiningonald K Kaly Mcquary, PA-C  prochlorperazine (COMPAZINE) 10 MG tablet Take 1 tablet (10 mg total) by mouth every 6 (six) hours as needed for nausea (headache). 07/03/15 07/02/16  Phineas SemenGraydon Goodman, MD  traMADol (ULTRAM) 50 MG tablet Take 1 tablet (50 mg total) by mouth every 6 (six) hours as needed. 09/26/16 09/26/17  Joni Reiningonald K Lyrical Sowle, PA-C    Allergies Dilaudid [hydromorphone]; Aspirin; Penicillins; and Sulfa antibiotics  No family history on file.  Social  History Social History  Substance Use Topics  . Smoking status: Current Every Day Smoker  . Smokeless tobacco: Never Used  . Alcohol use Not on file    Review of Systems Constitutional: Chills and body aches.  Eyes: No visual changes. ENT: Nasal congestion, postnasal drainage, bilateral ear pressure, sore throat.  Cardiovascular: Denies chest pain. Respiratory: Denies shortness of breath. Gastrointestinal: No abdominal pain.  No nausea, no vomiting.  Diarrhea . Genitourinary: Negative for dysuria. Musculoskeletal: Negative for back pain. Skin: Negative for rash. Neurological: Positive for frontal headache but denies focal weakness or numbness.  Endocrine:Hypertension Hematological/Lymphatic: Allergic/Immunilogical: See medication list 10-point ROS otherwise negative.  ____________________________________________   PHYSICAL EXAM:  VITAL SIGNS: ED Triage Vitals  Enc Vitals Group     BP 09/26/16 1725 (!) 176/99     Pulse Rate 09/26/16 1725 94     Resp 09/26/16 1725 18     Temp 09/26/16 1725 98.3 F (36.8 C)     Temp Source 09/26/16 1725 Oral     SpO2 09/26/16 1725 100 %     Weight 09/26/16 1725 285 lb (129.3 kg)     Height 09/26/16 1725 5\' 5"  (1.651 m)     Head Circumference --      Peak Flow --      Pain Score 09/26/16 1726 9     Pain Loc --      Pain Edu? --      Excl. in GC? --     Constitutional: Alert and oriented. Well appearing and  in no acute distress. Morbid obesity Eyes: Conjunctivae are normal. PERRL. EOMI. Head: Atraumatic. Nose: Bilateral maxillary guarding edematous nasal turbinates.  Mouth/Throat: Mucous membranes are moist. Oropharynx erythematous with copious postnasal drainage Neck: No stridor.  No cervical spine tenderness to palpation. Hematological/Lymphatic/Immunilogical: No cervical lymphadenopathy. Cardiovascular: Normal rate, regular rhythm. Grossly normal heart sounds.  Good peripheral circulation. Elevated blood pressure. Respiratory:  Normal respiratory effort.  No retractions. Lungs CTAB. Gastrointestinal: Soft and nontender. No distention. No abdominal bruits. No CVA tenderness. Musculoskeletal: No lower extremity tenderness nor edema.  No joint effusions. Neurologic:  Normal speech and language. No gross focal neurologic deficits are appreciated. No gait instability. Skin:  Skin is warm, dry and intact. No rash noted. Psychiatric: Mood and affect are normal. Speech and behavior are normal.  ____________________________________________   LABS (all labs ordered are listed, but only abnormal results are displayed)  Labs Reviewed  INFLUENZA PANEL BY PCR (TYPE A & B, H1N1) - Abnormal; Notable for the following:       Result Value   Influenza A By PCR POSITIVE (*)    All other components within normal limits   ___Positive influenza _________________________________________  EKG   ____________________________________________  RADIOLOGY   ____________________________________________   PROCEDURES  Procedure(s) performed: None  Procedures  Critical Care performed: No  ____________________________________________   INITIAL IMPRESSION / ASSESSMENT AND PLAN / ED COURSE  Pertinent labs & imaging results that were available during my care of the patient were reviewed by me and considered in my medical decision making (see chart for details).  Influenza. Patient given discharge Instructions. Patient given a work note. Patient given prescription for Tamiflu, tramadol, and ibuprofen.  Clinical Course      ____________________________________________   FINAL CLINICAL IMPRESSION(S) / ED DIAGNOSES  Final diagnoses:  Influenza      NEW MEDICATIONS STARTED DURING THIS VISIT:  New Prescriptions   IBUPROFEN (ADVIL,MOTRIN) 800 MG TABLET    Take 1 tablet (800 mg total) by mouth every 8 (eight) hours as needed for moderate pain.   OSELTAMIVIR (TAMIFLU) 75 MG CAPSULE    Take 1 capsule (75 mg total) by  mouth 2 (two) times daily.   TRAMADOL (ULTRAM) 50 MG TABLET    Take 1 tablet (50 mg total) by mouth every 6 (six) hours as needed.     Note:  This document was prepared using Dragon voice recognition software and may include unintentional dictation errors.    Joni ReiningRonald K Lubna Stegeman, PA-C 09/26/16 1924    Sharman CheekPhillip Stafford, MD 09/27/16 [phone]

## 2016-09-26 NOTE — ED Triage Notes (Signed)
Pt with flu like sx since Saturday.

## 2017-11-19 ENCOUNTER — Other Ambulatory Visit: Payer: Self-pay

## 2017-11-19 ENCOUNTER — Emergency Department
Admission: EM | Admit: 2017-11-19 | Discharge: 2017-11-20 | Disposition: A | Discharge status: Home / Self Care | Payer: Medicaid Other | Attending: Emergency Medicine | Admitting: Emergency Medicine

## 2017-11-19 ENCOUNTER — Emergency Department: Payer: Medicaid Other

## 2017-11-19 DIAGNOSIS — R109 Unspecified abdominal pain: Secondary | ICD-10-CM

## 2017-11-19 DIAGNOSIS — R1011 Right upper quadrant pain: Secondary | ICD-10-CM | POA: Insufficient documentation

## 2017-11-19 DIAGNOSIS — F17200 Nicotine dependence, unspecified, uncomplicated: Secondary | ICD-10-CM | POA: Insufficient documentation

## 2017-11-19 LAB — CBC WITH DIFFERENTIAL/PLATELET
BASOS ABS: 0.2 10*3/uL — AB (ref 0–0.1)
Basophils Relative: 2 %
Eosinophils Absolute: 0.2 10*3/uL (ref 0–0.7)
Eosinophils Relative: 2 %
HEMATOCRIT: 42.3 % (ref 35.0–47.0)
Hemoglobin: 14.4 g/dL (ref 12.0–16.0)
LYMPHS PCT: 41 %
Lymphs Abs: 4.4 10*3/uL — ABNORMAL HIGH (ref 1.0–3.6)
MCH: 31.9 pg (ref 26.0–34.0)
MCHC: 34 g/dL (ref 32.0–36.0)
MCV: 93.9 fL (ref 80.0–100.0)
MONO ABS: 0.4 10*3/uL (ref 0.2–0.9)
Monocytes Relative: 4 %
NEUTROS ABS: 5.5 10*3/uL (ref 1.4–6.5)
Neutrophils Relative %: 51 %
Platelets: 216 10*3/uL (ref 150–440)
RBC: 4.51 MIL/uL (ref 3.80–5.20)
RDW: 13.6 % (ref 11.5–14.5)
WBC: 10.6 10*3/uL (ref 3.6–11.0)

## 2017-11-19 LAB — URINALYSIS, COMPLETE (UACMP) WITH MICROSCOPIC
Bacteria, UA: NONE SEEN
Bilirubin Urine: NEGATIVE
Glucose, UA: NEGATIVE mg/dL
Hgb urine dipstick: NEGATIVE
KETONES UR: 5 mg/dL — AB
Nitrite: NEGATIVE
PH: 6 (ref 5.0–8.0)
Protein, ur: NEGATIVE mg/dL
SPECIFIC GRAVITY, URINE: 1.031 — AB (ref 1.005–1.030)

## 2017-11-19 LAB — COMPREHENSIVE METABOLIC PANEL
ALK PHOS: 93 U/L (ref 38–126)
ALT: 17 U/L (ref 14–54)
AST: 21 U/L (ref 15–41)
Albumin: 3.8 g/dL (ref 3.5–5.0)
Anion gap: 10 (ref 5–15)
BILIRUBIN TOTAL: 0.4 mg/dL (ref 0.3–1.2)
BUN: 15 mg/dL (ref 6–20)
CO2: 24 mmol/L (ref 22–32)
CREATININE: 1.19 mg/dL — AB (ref 0.44–1.00)
Calcium: 8.8 mg/dL — ABNORMAL LOW (ref 8.9–10.3)
Chloride: 105 mmol/L (ref 101–111)
GFR calc Af Amer: >60 mL/min (ref >60)
GFR, EST NON AFRICAN AMERICAN: 59 mL/min — AB (ref >60)
Glucose, Bld: 128 mg/dL — ABNORMAL HIGH (ref 65–99)
Potassium: 3.4 mmol/L — ABNORMAL LOW (ref 3.5–5.1)
Sodium: 139 mmol/L (ref 135–145)
TOTAL PROTEIN: 7.4 g/dL (ref 6.5–8.1)

## 2017-11-19 LAB — LIPASE, BLOOD: LIPASE: 39 U/L (ref 11–51)

## 2017-11-19 LAB — POCT PREGNANCY, URINE: Preg Test, Ur: NEGATIVE

## 2017-11-19 MED ORDER — ONDANSETRON HCL 4 MG/2ML IJ SOLN
INTRAMUSCULAR | Status: AC
  Administered 2017-11-19: 4 mg via INTRAVENOUS
  Filled 2017-11-19: qty 2

## 2017-11-19 MED ORDER — HALOPERIDOL LACTATE 5 MG/ML IJ SOLN
5.0000 mg | Freq: Once | INTRAMUSCULAR | Status: AC
  Administered 2017-11-19: 5 mg via INTRAVENOUS
  Filled 2017-11-19: qty 1

## 2017-11-19 MED ORDER — GI COCKTAIL ~~LOC~~
30.0000 mL | Freq: Once | ORAL | Status: AC
  Administered 2017-11-19: 30 mL via ORAL
  Filled 2017-11-19: qty 30

## 2017-11-19 MED ORDER — DICYCLOMINE HCL 10 MG/ML IM SOLN
20.0000 mg | Freq: Once | INTRAMUSCULAR | Status: AC
  Administered 2017-11-19: 20 mg via INTRAMUSCULAR
  Filled 2017-11-19: qty 2

## 2017-11-19 MED ORDER — ONDANSETRON HCL 4 MG/2ML IJ SOLN
4.0000 mg | Freq: Once | INTRAMUSCULAR | Status: AC
  Administered 2017-11-19: 4 mg via INTRAVENOUS

## 2017-11-19 MED ORDER — DICYCLOMINE HCL 20 MG PO TABS: 20.0000 mg | ORAL_TABLET | Freq: Three times a day (TID) | ORAL | 0 refills | Status: DC | PRN

## 2017-11-19 NOTE — ED Provider Notes (Signed)
[facility] Emergency Department Provider Note   ____________________________________________   I have reviewed the triage vital signs and the nursing notes.   HISTORY  Chief Complaint Abdominal Pain   History limited by: Not Limited   HPI Rebekah Thomas is a 32 y.o. female who presents to the emergency department today because of abdominal pain. It is located in the right upper quadrant. It started four days ago. Initially it would come and go but it has been constant today. It is severe. It has been accompanied by chills. She states she has had similar pain in the past. States she was told she had gallbladder issues by her PCP but denies ever having imaging of her gallbladder performed. She had mother and sister who had gallstones.   Per medical record review patient has a history of migraine.  Past Medical History:  Diagnosis Date  . Migraine     There are no active problems to display for this patient.   Past Surgical History:  Procedure Laterality Date  . ANKLE SURGERY    . knee surgery      Prior to Admission medications   Medication Sig Start Date End Date Taking? Authorizing Provider  ibuprofen (ADVIL,MOTRIN) 800 MG tablet Take 1 tablet (800 mg total) by mouth every 8 (eight) hours as needed for moderate pain. 09/26/16   Joni ReiningSmith, Ronald K, PA-C  prochlorperazine (COMPAZINE) 10 MG tablet Take 1 tablet (10 mg total) by mouth every 6 (six) hours as needed for nausea (headache). 07/03/15 07/02/16  Phineas SemenGoodman, Alivea Gladson, MD    Allergies Dilaudid [hydromorphone]; Aspirin; Penicillins; and Sulfa antibiotics  No family history on file.  Social History Social History   Tobacco Use  . Smoking status: Current Every Day Smoker  . Smokeless tobacco: Never Used  Substance Use Topics  . Alcohol use: Not on file  . Drug use: Not on file    Review of Systems Constitutional: Positive for chills. Eyes: No visual changes. ENT: No sore  throat. Cardiovascular: Denies chest pain. Respiratory: Denies shortness of breath. Gastrointestinal: Positive for abdominal pain.  Genitourinary: Negative for dysuria. Musculoskeletal: Negative for back pain. Skin: Negative for rash. Neurological: Negative for headaches, focal weakness or numbness.  ____________________________________________   PHYSICAL EXAM:  VITAL SIGNS: ED Triage Vitals  Enc Vitals Group     BP 11/19/17 2141 (!) 158/91     Pulse Rate 11/19/17 2140 85     Resp 11/19/17 2140 18     Temp 11/19/17 2141 98.5 F (36.9 C)     Temp Source 11/19/17 2141 Oral     SpO2 11/19/17 2140 98 %     Weight 11/19/17 2140 295 lb (133.8 kg)     Height 11/19/17 2140 5\' 4"  (1.626 m)     Head Circumference --      Peak Flow --      Pain Score 11/19/17 2140 8   Constitutional: Alert and oriented. Well appearing and in no distress. Eyes: Conjunctivae are normal.  ENT   Head: Normocephalic and atraumatic.   Nose: No congestion/rhinnorhea.   Mouth/Throat: Mucous membranes are moist.   Neck: No stridor. Hematological/Lymphatic/Immunilogical: No cervical lymphadenopathy. Cardiovascular: Normal rate, regular rhythm.  No murmurs, rubs, or gallops.  Respiratory: Normal respiratory effort without tachypnea nor retractions. Breath sounds are clear and equal bilaterally. No wheezes/rales/rhonchi. Gastrointestinal: Soft and tender to palpation in the right upper quadrant.  Genitourinary: Deferred Musculoskeletal: Normal range of motion in all extremities. No lower extremity edema. Neurologic:  Normal speech and language. No gross focal neurologic deficits are appreciated.  Skin:  Skin is warm, dry and intact. No rash noted. Psychiatric: Mood and affect are normal. Speech and behavior are normal. Patient exhibits appropriate insight and judgment.  ____________________________________________    LABS (pertinent positives/negatives)  Lipase 39 Upreg negative CBC wbc  10.6, hgb 14.4, plt 216 CMP k 3.4, glu 128, cr 1.19 UA wnl ____________________________________________   EKG  I, Phineas Semen, attending physician, personally viewed and interpreted this EKG  EKG Time: 2332 Rate: 70 Rhythm: sinus rhythm Axis: right axis deviation Intervals: qtc 447 QRS: narrow ST changes: no st elevation Impression: abnormal ekg  ____________________________________________    RADIOLOGY  [place] RUQ WNL  ____________________________________________   PROCEDURES  Procedures  ____________________________________________   INITIAL IMPRESSION / ASSESSMENT AND PLAN / ED COURSE  Pertinent labs & imaging results that were available during my care of the patient were reviewed by me and considered in my medical decision making (see chart for details).  Patient presented to the emergency department today because of concerns for right upper quadrant abdominal pain.  On exam patient appears somewhat uncomfortable.  She states this been going on for the past 4 days has had similar pain in the past.  Differential would include gallbladder disease, gallstones, gastritis, hepatitis, pancreatitis amongst other etiologies.  Right upper ultrasound did not show any concerning gallbladder stones or wall thickening or pericholecystic fluid.  Blood work without any concerning leukocytosis, elevated lipase or hepatic function panel abnormalities. Will try to treat the patient symptomatically.   ____________________________________________   FINAL CLINICAL IMPRESSION(S) / ED DIAGNOSES  Final diagnoses:  RUQ pain     Note: This dictation was prepared with Dragon dictation. Any transcriptional errors that result from this process are unintentional     Phineas Semen, MD 11/19/17 2336

## 2017-11-19 NOTE — ED Triage Notes (Signed)
Pt with ruq pain for four days with nausea.

## 2017-11-19 NOTE — Discharge Instructions (Signed)
Please seek medical attention for any high fevers, chest pain, shortness of breath, change in behavior, persistent vomiting, bloody stool or any other new or concerning symptoms.  

## 2017-11-20 NOTE — ED Provider Notes (Signed)
-----------------------------------------   12:34 AM on 11/20/2017 -----------------------------------------   Blood pressure 134/84, pulse 73, temperature 98.5 F (36.9 C), temperature source Oral, resp. rate 17, height 5\' 4"  (1.626 m), weight 133.8 kg (295 lb), SpO2 98 %.  Assuming care from Dr. Derrill KayGoodman.  In short, Theo Dillsatoria R Riege is a 32 y.o. female with a chief complaint of Abdominal Pain .  Refer to the original H&P for additional details.  The current plan of care is to reassess the patient after the pain medication.     The patient's pain has improved to a 0 out of 10.  She will be discharged home to follow-up as instructed by Dr. Derrill KayGoodman.   Rebecka ApleyWebster, Sydnee Lamour P, MD 11/20/17 [phone]

## 2019-08-16 ENCOUNTER — Other Ambulatory Visit: Payer: Self-pay

## 2019-12-16 ENCOUNTER — Emergency Department
Admission: EM | Admit: 2019-12-16 | Discharge: 2019-12-16 | Disposition: A | Discharge status: Home / Self Care | Payer: Self-pay | Attending: Emergency Medicine | Admitting: Emergency Medicine

## 2019-12-16 ENCOUNTER — Encounter: Payer: Self-pay | Admitting: Emergency Medicine

## 2019-12-16 ENCOUNTER — Emergency Department: Payer: Self-pay

## 2019-12-16 ENCOUNTER — Other Ambulatory Visit: Payer: Self-pay

## 2019-12-16 DIAGNOSIS — I10 Essential (primary) hypertension: Secondary | ICD-10-CM

## 2019-12-16 DIAGNOSIS — J219 Acute bronchiolitis, unspecified: Secondary | ICD-10-CM

## 2019-12-16 DIAGNOSIS — F17210 Nicotine dependence, cigarettes, uncomplicated: Secondary | ICD-10-CM | POA: Insufficient documentation

## 2019-12-16 HISTORY — DX: Essential (primary) hypertension: I10

## 2019-12-16 MED ORDER — AZITHROMYCIN 250 MG PO TABS: ORAL_TABLET | ORAL | 0 refills | Status: DC

## 2019-12-16 MED ORDER — PREDNISONE 10 MG (21) PO TBPK: ORAL_TABLET | ORAL | 0 refills | Status: DC

## 2019-12-16 NOTE — ED Provider Notes (Signed)
[facility] Emergency Department Provider Note  ____________________________________________   First MD Initiated Contact with Patient 12/16/19 1052     (approximate)  I have reviewed the triage vital signs and the nursing notes.   HISTORY  Chief Complaint Cough    HPI Rebekah Thomas is a 34 y.o. female presents emergency department complaint of a cough for 2 weeks.  States she was tested for Covid on 12/11/2019 which was negative.  States she continues to call follow-up colored phlegm.  No fever or chills.  Still has a sense of taste and smell.  No vomiting or diarrhea.    Past Medical History:  Diagnosis Date  . Hypertension   . Migraine     There are no problems to display for this patient.   Past Surgical History:  Procedure Laterality Date  . ANKLE SURGERY    . knee surgery      Prior to Admission medications   Medication Sig Start Date End Date Taking? Authorizing Provider  azithromycin (ZITHROMAX Z-PAK) 250 MG tablet 2 pills today then 1 pill a day for 4 days 12/16/19   Sherrie Mustache, Roselyn Bering, PA-C  predniSONE (STERAPRED UNI-PAK 21 TAB) 10 MG (21) TBPK tablet Take 6 pills on day one then decrease by 1 pill each day 12/16/19   Faythe Ghee, PA-C    Allergies Dilaudid [hydromorphone], Penicillins, Sulfa antibiotics, and Aspirin  No family history on file.  Social History Social History   Tobacco Use  . Smoking status: Current Every Day Smoker    Packs/day: 0.50    Types: Cigarettes  . Smokeless tobacco: Never Used  Substance Use Topics  . Alcohol use: Never  . Drug use: Never    Review of Systems  Constitutional: No fever/chills Eyes: No visual changes. ENT: No sore throat. Respiratory: Positive for productive cough Cardiovascular: Denies chest pain Gastrointestinal: Denies abdominal pain Genitourinary: Negative for dysuria. Musculoskeletal: Negative for back pain. Skin: Negative for rash. Psychiatric: no mood changes,      ____________________________________________   PHYSICAL EXAM:  VITAL SIGNS: ED Triage Vitals  Enc Vitals Group     BP 12/16/19 1024 (!) 175/102     Pulse Rate 12/16/19 1023 78     Resp 12/16/19 1023 16     Temp 12/16/19 1023 98.7 F (37.1 C)     Temp Source 12/16/19 1023 Oral     SpO2 12/16/19 1023 99 %     Weight 12/16/19 1020 285 lb (129.3 kg)     Height 12/16/19 1020 5\' 5"  (1.651 m)     Head Circumference --      Peak Flow --      Pain Score 12/16/19 1020 6     Pain Loc --      Pain Edu? --      Excl. in GC? --     Constitutional: Alert and oriented. Well appearing and in no acute distress. Eyes: Conjunctivae are normal.  Head: Atraumatic. Nose: No congestion/rhinnorhea. Mouth/Throat: Mucous membranes are moist.   Neck:  supple no lymphadenopathy noted Cardiovascular: Normal rate, regular rhythm. Heart sounds are normal Respiratory: Normal respiratory effort.  No retractions, lungs c t a  GU: deferred Musculoskeletal: FROM all extremities, warm and well perfused Neurologic:  Normal speech and language.  Skin:  Skin is warm, dry and intact. No rash noted. Psychiatric: Mood and affect are normal. Speech and behavior are normal.  ____________________________________________   LABS (all labs ordered are listed, but only abnormal  results are displayed)  Labs Reviewed - No data to display ____________________________________________   ____________________________________________  RADIOLOGY  Chest x-ray is negative for pneumonia or Covid opacities  ____________________________________________   PROCEDURES  Procedure(s) performed: No  Procedures    ____________________________________________   INITIAL IMPRESSION / ASSESSMENT AND PLAN / ED COURSE  Pertinent labs & imaging results that were available during my care of the patient were reviewed by me and considered in my medical decision making (see chart for details).   Patient is a  34 year old female presents emergency department complaining of a cough for 2 weeks.  See HPI  Physical exam patient appears well.  Is afebrile.  O2 saturations 99% on room air.  Lungs are clear to all station.  Cough is wet  Chest x-ray is negative for pneumonia or Covid opacities.  Explained the findings to the patient.  She was given a prescription for Z-Pak and steroid pack.  She is to follow-up with her regular doctor if not improving in 3 days.  I did discuss fact that her heart is becoming enlarged due to her hypertension.  She states she does not always take blood pressure medication.  Explained her it is very important to take this medication daily.  Sequela was discussed with her.  She was discharged in stable condition with a work note for 2 days.    Rebekah Thomas was evaluated in Emergency Department on 12/16/2019 for the symptoms described in the history of present illness. She was evaluated in the context of the global COVID-19 pandemic, which necessitated consideration that the patient might be at risk for infection with the SARS-CoV-2 virus that causes COVID-19. Institutional protocols and algorithms that pertain to the evaluation of patients at risk for COVID-19 are in a state of rapid change based on information released by regulatory bodies including the CDC and federal and state organizations. These policies and algorithms were followed during the patient's care in the ED.   As part of my medical decision making, I reviewed the following data within the [place] notes reviewed and incorporated, Old chart reviewed, Radiograph reviewed chest x-ray is negative, Notes from prior ED visits and [place] Controlled Substance Database  ____________________________________________   FINAL CLINICAL IMPRESSION(S) / ED DIAGNOSES  Final diagnoses:  Acute bronchiolitis due to unspecified organism  Essential hypertension      NEW MEDICATIONS STARTED DURING THIS  VISIT:  New Prescriptions   AZITHROMYCIN (ZITHROMAX Z-PAK) 250 MG TABLET    2 pills today then 1 pill a day for 4 days   PREDNISONE (STERAPRED UNI-PAK 21 TAB) 10 MG (21) TBPK TABLET    Take 6 pills on day one then decrease by 1 pill each day     Note:  This document was prepared using Dragon voice recognition software and may include unintentional dictation errors.    Versie Starks, PA-C 12/16/19 1151    Earleen Newport, MD 12/16/19 [phone]

## 2019-12-16 NOTE — ED Notes (Signed)
See triage note  Presents with cough   States she has a sore throat after coughing   Also states the cough has been so hard that she has vomited   States she had fever last pm at home but is afebrile on arrival  States she had a negative swab on 03/03

## 2019-12-16 NOTE — Discharge Instructions (Signed)
Follow-up with your regular doctor if not improving in 3 days. Take the antibiotic as prescribed.  Take the Sterapred as prescribed. You may take over-the-counter Delsym for cough if needed. Take your blood pressure medication daily as your blood pressure is elevated without your medication.

## 2019-12-16 NOTE — ED Triage Notes (Signed)
Pt in via POV, reports ongoing cough, generalized body aches x 2 weeks. Reports negative Covid test.  Ambulatory to triage; NAD noted at this time.

## 2020-03-11 ENCOUNTER — Other Ambulatory Visit: Payer: Self-pay | Admitting: Orthopedic Surgery

## 2020-03-11 ENCOUNTER — Ambulatory Visit
Admission: RE | Admit: 2020-03-11 | Discharge: 2020-03-11 | Disposition: A | Discharge status: Home / Self Care | Payer: 59 | Source: Ambulatory Visit | Attending: Orthopedic Surgery | Admitting: Orthopedic Surgery

## 2020-03-11 ENCOUNTER — Other Ambulatory Visit: Payer: Self-pay

## 2020-03-11 DIAGNOSIS — M5416 Radiculopathy, lumbar region: Secondary | ICD-10-CM | POA: Diagnosis present

## 2020-06-19 ENCOUNTER — Encounter: Payer: Self-pay | Admitting: Emergency Medicine

## 2020-06-19 ENCOUNTER — Emergency Department
Admission: EM | Admit: 2020-06-19 | Discharge: 2020-06-19 | Disposition: A | Discharge status: Home / Self Care | Payer: 59 | Attending: Emergency Medicine | Admitting: Emergency Medicine

## 2020-06-19 ENCOUNTER — Other Ambulatory Visit: Payer: Self-pay

## 2020-06-19 ENCOUNTER — Emergency Department: Payer: 59

## 2020-06-19 DIAGNOSIS — F17210 Nicotine dependence, cigarettes, uncomplicated: Secondary | ICD-10-CM | POA: Insufficient documentation

## 2020-06-19 DIAGNOSIS — I10 Essential (primary) hypertension: Secondary | ICD-10-CM | POA: Insufficient documentation

## 2020-06-19 DIAGNOSIS — M79672 Pain in left foot: Secondary | ICD-10-CM

## 2020-06-19 LAB — URIC ACID: Uric Acid, Serum: 5.6 mg/dL (ref 2.5–7.1)

## 2020-06-19 NOTE — ED Notes (Signed)
See triage note. Pt stating her left foot has been hurting x4 days. Pt denies injury or hearing a pop. Pt stating it is swollen and hot to the touch.

## 2020-06-19 NOTE — ED Provider Notes (Signed)
[facility] Emergency Department Provider Note  ____________________________________________   First MD Initiated Contact with Patient 06/19/20 1419     (approximate)  I have reviewed the triage vital signs and the nursing notes.   HISTORY  Chief Complaint Foot Pain    HPI Rebekah Thomas is a 34 y.o. female presents emergency department complaining of left foot pain.  Foot has been hurting for about 4 days.  Was concerned it was either gout or an injury.  No known injury.  Patient does take a blood pressure medicine with a water pill.  No history of gout.  No numbness or tingling.  No other complaints.    Past Medical History:  Diagnosis Date  . Hypertension   . Migraine     There are no problems to display for this patient.   Past Surgical History:  Procedure Laterality Date  . ANKLE SURGERY    . knee surgery      Prior to Admission medications   Not on File    Allergies Dilaudid [hydromorphone], Penicillins, Sulfa antibiotics, and Aspirin  History reviewed. No pertinent family history.  Social History Social History   Tobacco Use  . Smoking status: Current Every Day Smoker    Packs/day: 0.50    Types: Cigarettes  . Smokeless tobacco: Never Used  Vaping Use  . Vaping Use: Never used  Substance Use Topics  . Alcohol use: Never  . Drug use: Never    Review of Systems  Constitutional: No fever/chills Eyes: No visual changes. ENT: No sore throat. Respiratory: Denies cough Cardiovascular: Denies chest pain Gastrointestinal: Denies abdominal pain Genitourinary: Negative for dysuria. Musculoskeletal: Negative for back pain.  Positive for left foot pain Skin: Negative for rash. Psychiatric: no mood changes,     ____________________________________________   PHYSICAL EXAM:  VITAL SIGNS: ED Triage Vitals  Enc Vitals Group     BP 06/19/20 1240 129/89     Pulse Rate 06/19/20 1240 92     Resp 06/19/20 1240 18      Temp 06/19/20 1240 98.7 F (37.1 C)     Temp Source 06/19/20 1240 Oral     SpO2 06/19/20 1240 100 %     Weight 06/19/20 1238 298 lb (135.2 kg)     Height 06/19/20 1238 5\' 4"  (1.626 m)     Head Circumference --      Peak Flow --      Pain Score 06/19/20 1237 9     Pain Loc --      Pain Edu? --      Excl. in GC? --     Constitutional: Alert and oriented. Well appearing and in no acute distress. Eyes: Conjunctivae are normal.  Head: Atraumatic. Nose: No congestion/rhinnorhea. Mouth/Throat: Mucous membranes are moist.   Neck:  supple no lymphadenopathy noted Cardiovascular: Normal rate, regular rhythm.  Respiratory: Normal respiratory effort.  No retractions,  GU: deferred Musculoskeletal: FROM all extremities, warm and well perfused, the dorsum of the left foot and plantar surface of the left foot are tender to palpation, no redness swelling typical of gout is noted.  Neurovascular is intact Neurologic:  Normal speech and language.  Skin:  Skin is warm, dry and intact. No rash noted. Psychiatric: Mood and affect are normal. Speech and behavior are normal.  ____________________________________________   LABS (all labs ordered are listed, but only abnormal results are displayed)  Labs Reviewed  URIC ACID   ____________________________________________   ____________________________________________  RADIOLOGY  08/19/20  of the left foot is negative for any acute abnormality  ____________________________________________   PROCEDURES  Procedure(s) performed: No  Procedures    ____________________________________________   INITIAL IMPRESSION / ASSESSMENT AND PLAN / ED COURSE  Pertinent labs & imaging results that were available during my care of the patient were reviewed by me and considered in my medical decision making (see chart for details).   Patient is a 34 year old female presents emergency department with left foot pain.  See HPI.  Physical exam is consistent  with more of an inflammatory process.  X-ray of the left foot does not show any bony abnormality, there is localized soft tissue swelling.  Uric acid to rule out gout  Uric acid is normal.  I did explain findings to the patient.  She is placed in wooden shoe.  Instructed to continue her meloxicam.  Ice the foot.  Follow-up podiatry if not better in 1 week.  She is discharged stable condition.     Rebekah Thomas was evaluated in Emergency Department on 06/19/2020 for the symptoms described in the history of present illness. She was evaluated in the context of the global COVID-19 pandemic, which necessitated consideration that the patient might be at risk for infection with the SARS-CoV-2 virus that causes COVID-19. Institutional protocols and algorithms that pertain to the evaluation of patients at risk for COVID-19 are in a state of rapid change based on information released by regulatory bodies including the CDC and federal and state organizations. These policies and algorithms were followed during the patient's care in the ED.    As part of my medical decision making, I reviewed the following data within the electronic MEDICAL RECORD NUMBER Nursing notes reviewed and incorporated, Labs reviewed , Old chart reviewed, Radiograph reviewed , Notes from prior ED visits and [place] Controlled Substance Database  ____________________________________________   FINAL CLINICAL IMPRESSION(S) / ED DIAGNOSES  Final diagnoses:  Foot pain, left      NEW MEDICATIONS STARTED DURING THIS VISIT:  Current Discharge Medication List       Note:  This document was prepared using Dragon voice recognition software and may include unintentional dictation errors.    Faythe Ghee, PA-C 06/19/20 1559    Arnaldo Natal, MD 06/23/20 [phone]

## 2020-06-19 NOTE — Discharge Instructions (Addendum)
Follow-up with [facility] clinic podiatry if not improving in 5 to 7 days.  Continue take your meloxicam.  Add Tylenol.  Elevate and ice.  Wear the wooden shoe for 1 to 2 weeks to see if you have improvement in foot pain.

## 2020-06-19 NOTE — ED Triage Notes (Signed)
Here for left foot pain, pt feels like pain in the bones. Denies injury. NAD. Unlabored.

## 2020-06-19 NOTE — ED Notes (Signed)
Two unsuccessful lab draw attempts. Lab was called. Provider aware.

## 2020-06-30 ENCOUNTER — Ambulatory Visit: Payer: Self-pay | Admitting: Podiatry

## 2020-08-23 ENCOUNTER — Emergency Department
Admission: EM | Admit: 2020-08-23 | Discharge: 2020-08-23 | Disposition: A | Discharge status: Home / Self Care | Payer: 59 | Attending: Emergency Medicine | Admitting: Emergency Medicine

## 2020-08-23 ENCOUNTER — Other Ambulatory Visit: Payer: Self-pay

## 2020-08-23 ENCOUNTER — Emergency Department: Payer: 59

## 2020-08-23 DIAGNOSIS — Z79899 Other long term (current) drug therapy: Secondary | ICD-10-CM | POA: Diagnosis not present

## 2020-08-23 DIAGNOSIS — I10 Essential (primary) hypertension: Secondary | ICD-10-CM | POA: Diagnosis not present

## 2020-08-23 DIAGNOSIS — M79602 Pain in left arm: Secondary | ICD-10-CM | POA: Diagnosis not present

## 2020-08-23 DIAGNOSIS — R079 Chest pain, unspecified: Secondary | ICD-10-CM

## 2020-08-23 DIAGNOSIS — F17210 Nicotine dependence, cigarettes, uncomplicated: Secondary | ICD-10-CM | POA: Diagnosis not present

## 2020-08-23 DIAGNOSIS — E876 Hypokalemia: Secondary | ICD-10-CM | POA: Insufficient documentation

## 2020-08-23 LAB — CBC
HCT: 41.6 % (ref 36.0–46.0)
Hemoglobin: 13.6 g/dL (ref 12.0–15.0)
MCH: 31.3 pg (ref 26.0–34.0)
MCHC: 32.7 g/dL (ref 30.0–36.0)
MCV: 95.9 fL (ref 80.0–100.0)
Platelets: 254 10*3/uL (ref 150–400)
RBC: 4.34 MIL/uL (ref 3.87–5.11)
RDW: 14.3 % (ref 11.5–15.5)
WBC: 12 10*3/uL — ABNORMAL HIGH (ref 4.0–10.5)
nRBC: 0 % (ref 0.0–0.2)

## 2020-08-23 LAB — TROPONIN I (HIGH SENSITIVITY)
Troponin I (High Sensitivity): 5 ng/L (ref <18)
Troponin I (High Sensitivity): 5 ng/L (ref <18)

## 2020-08-23 LAB — BASIC METABOLIC PANEL
Anion gap: 13 (ref 5–15)
BUN: 11 mg/dL (ref 6–20)
CO2: 23 mmol/L (ref 22–32)
Calcium: 8.8 mg/dL — ABNORMAL LOW (ref 8.9–10.3)
Chloride: 104 mmol/L (ref 98–111)
Creatinine, Ser: 0.94 mg/dL (ref 0.44–1.00)
GFR, Estimated: >60 mL/min (ref >60)
Glucose, Bld: 91 mg/dL (ref 70–99)
Potassium: 2.9 mmol/L — ABNORMAL LOW (ref 3.5–5.1)
Sodium: 140 mmol/L (ref 135–145)

## 2020-08-23 MED ORDER — ORPHENADRINE CITRATE 30 MG/ML IJ SOLN
60.0000 mg | Freq: Once | INTRAMUSCULAR | Status: AC
  Administered 2020-08-23: 60 mg via INTRAMUSCULAR
  Filled 2020-08-23: qty 2

## 2020-08-23 MED ORDER — POTASSIUM CHLORIDE CRYS ER 20 MEQ PO TBCR
40.0000 meq | EXTENDED_RELEASE_TABLET | Freq: Once | ORAL | Status: AC
  Administered 2020-08-23: 40 meq via ORAL
  Filled 2020-08-23: qty 2

## 2020-08-23 MED ORDER — ORPHENADRINE CITRATE ER 100 MG PO TB12: 100.0000 mg | ORAL_TABLET | Freq: Two times a day (BID) | ORAL | 0 refills | Status: AC

## 2020-08-23 NOTE — Discharge Instructions (Signed)
Please follow up with the primary care provider.  Return to the ER for symptoms that change or worsen if unable to schedule an appointment.

## 2020-08-23 NOTE — ED Provider Notes (Signed)
[facility] Emergency Department Provider Note ____________________________________________   First MD Initiated Contact with Patient 08/23/20 1823     (approximate)  I have reviewed the triage vital signs and the nursing notes.   HISTORY  Chief Complaint Chest Pain  HPI Rebekah Thomas is a 34 y.o. female with history of migraines and hypertension presents to the emergency department for treatment and evaluation of left arm pain that radiates up into the left neck and intermittently into the left chest.  Pain started while sitting on the couch playing a game on her phone.  She has not had similar symptoms in the past.  She denies shortness of breath.  She denies diaphoresis.  EMS gave 2 nitroglycerin in route without relief of symptoms..     Past Medical History:  Diagnosis Date  . Hypertension   . Migraine     Patient Active Problem List   Diagnosis Date Noted  . Bimalleolar ankle fracture, left, closed, initial encounter 10/08/2014    Past Surgical History:  Procedure Laterality Date  . ANKLE SURGERY    . knee surgery      Prior to Admission medications   Medication Sig Start Date End Date Taking? Authorizing Provider  cyclobenzaprine (FLEXERIL) 10 MG tablet Take 10 mg by mouth every 8 (eight) hours as needed. 05/18/20   [provider]  DULoxetine (CYMBALTA) 30 MG capsule Take 30 mg by mouth at bedtime. 06/03/20   [provider]  hydrochlorothiazide (HYDRODIURIL) 25 MG tablet Take 25 mg by mouth daily. 06/03/20   [provider]  HYDROcodone-acetaminophen (NORCO/VICODIN) 5-325 MG tablet Take 1 tablet by mouth at bedtime as needed. 05/18/20   [provider]  methylPREDNISolone (MEDROL DOSEPAK) 4 MG TBPK tablet Take by mouth as directed. 03/10/20   [provider]  orphenadrine (NORFLEX) 100 MG tablet Take 1 tablet (100 mg total) by mouth 2 (two) times daily. 08/23/20 08/23/21  Robi Mitter, Kasandra Knudsen, FNP   promethazine (PHENERGAN) 12.5 MG tablet Take 12.5 mg by mouth every 6 (six) hours as needed. 02/18/20   [provider]    Allergies Dilaudid [hydromorphone], Eggs or egg-derived products, Penicillins, Sulfa antibiotics, Tramadol, Amoxicillin-pot clavulanate, Aspirin, Cefdinir, Cheese, Ibuprofen, Lac bovis, Magnesium citrate, Meloxicam, Prednisone, and Pregabalin  History reviewed. No pertinent family history.  Social History Social History   Tobacco Use  . Smoking status: Current Every Day Smoker    Packs/day: 0.50    Types: Cigarettes  . Smokeless tobacco: Never Used  Vaping Use  . Vaping Use: Never used  Substance Use Topics  . Alcohol use: Never  . Drug use: Never    Review of Systems  Constitutional: No fever/chills Eyes: No visual changes. ENT: No sore throat. Cardiovascular: Positive for chest pain. Respiratory: Denies shortness of breath. Gastrointestinal: No abdominal pain.  No nausea, no vomiting.  No diarrhea.  No constipation. Genitourinary: Negative for dysuria. Musculoskeletal: Positive for left arm pain. Skin: Negative for rash. Neurological: Negative for headaches, focal weakness or numbness. ___________________________________________   PHYSICAL EXAM:  VITAL SIGNS: ED Triage Vitals  Enc Vitals Group     BP 08/23/20 1702 (!) 154/83     Pulse Rate 08/23/20 1702 89     Resp 08/23/20 1702 14     Temp 08/23/20 1702 98.5 F (36.9 C)     Temp Source 08/23/20 1702 Oral     SpO2 08/23/20 1702 100 %     Weight --      Height --  Head Circumference --      Peak Flow --      Pain Score 08/23/20 1703 10     Pain Loc --      Pain Edu? --      Excl. in GC? --     Constitutional: Alert and oriented. Well appearing and in no acute distress. Eyes: Conjunctivae are normal. PERRL. EOMI. Head: Atraumatic. Nose: No congestion/rhinnorhea. Mouth/Throat: Mucous membranes are moist.  Oropharynx non-erythematous. Neck: No stridor.    Hematological/Lymphatic/Immunilogical: No cervical lymphadenopathy. Cardiovascular: Normal rate, regular rhythm. Grossly normal heart sounds.  Good peripheral circulation. Respiratory: Normal respiratory effort.  No retractions. Lungs CTAB. Gastrointestinal: Soft and nontender. No distention. No abdominal bruits. No CVA tenderness. Genitourinary:  Musculoskeletal: No focal tenderness or decrease in ROM over the left arm.  Neurologic:  Normal speech and language. No gross focal neurologic deficits are appreciated. No gait instability. Skin:  Skin is warm, dry and intact. No rash noted. Psychiatric: Mood and affect are normal. Speech and behavior are normal.  ____________________________________________   LABS (all labs ordered are listed, but only abnormal results are displayed)  Labs Reviewed  BASIC METABOLIC PANEL - Abnormal; Notable for the following components:      Result Value   Potassium 2.9 (*)    Calcium 8.8 (*)    All other components within normal limits  CBC - Abnormal; Notable for the following components:   WBC 12.0 (*)    All other components within normal limits  TROPONIN I (HIGH SENSITIVITY)  TROPONIN I (HIGH SENSITIVITY)   ____________________________________________  EKG  ED ECG REPORT I, Constant Mandeville, FNP-BC personally viewed and interpreted this ECG.   Date: 08/23/2020  EKG Time: 1648  Rate: 88  Rhythm: normal EKG, normal sinus rhythm  Axis: left axis deviation  Intervals:none  ST&T Change: no ST elevation   ____________________________________________  RADIOLOGY  ED MD interpretation:      I, Kem Boroughs, personally viewed and evaluated these images (plain radiographs) as part of my medical decision making, as well as reviewing the written report by the radiologist.  Official radiology report(s): No results found.  ____________________________________________   PROCEDURES  Procedure(s) performed (including Critical  Care):  Procedures  ____________________________________________   INITIAL IMPRESSION / ASSESSMENT AND PLAN     34 year old female presenting to the emergency department for treatment and evaluation of chest pain and left arm pain.  See HPI for further details.  Plan will be to get serial cardiac enzymes.  She states that the pain starts in her arm, goes up into the left side of her neck and then intermittently is in the anterior left chest wall.  She has no cardiac history.  She takes Depo-Medrol every 3 months.  She does smoke.  No family history of cardiac death under age 69. She denies pain in the chest with deep inspiration.  She denies shortness of breath.  DIFFERENTIAL DIAGNOSIS  Musculoskeletal pain, cardiac event, cervical radiculopathy  ED COURSE  While here, patient was given Norflex with significant improvement in pain.  She states that she now feels that her symptoms were likely muscle spasms.  Plan will be to treat her with Norflex and have her take Tylenol if needed.  She is to schedule a follow-up appointment with her primary care provider for symptoms that change, worsen, or do not improve with medication.   ___________________________________________   FINAL CLINICAL IMPRESSION(S) / ED DIAGNOSES  Final diagnoses:  Hypokalemia  Nonspecific chest pain  Left arm pain  ED Discharge Orders         Ordered    orphenadrine (NORFLEX) 100 MG tablet  2 times daily        08/23/20 2022           Rebekah Thomas was evaluated in Emergency Department on 08/24/2020 for the symptoms described in the history of present illness. She was evaluated in the context of the global COVID-19 pandemic, which necessitated consideration that the patient might be at risk for infection with the SARS-CoV-2 virus that causes COVID-19. Institutional protocols and algorithms that pertain to the evaluation of patients at risk for COVID-19 are in a state of rapid change based on  information released by regulatory bodies including the CDC and federal and state organizations. These policies and algorithms were followed during the patient's care in the ED.   Note:  This document was prepared using Dragon voice recognition software and may include unintentional dictation errors.   Chinita Pester, FNP 08/24/20 2010    Arnaldo Natal, MD 08/27/20 1956

## 2020-08-23 NOTE — ED Triage Notes (Signed)
Pt presents via EMS c/o chest pain and left arm pain unrelieved by nitro x2 per EMS. EMS reports hypertensive. Pt tearful upon arrival. Denies SOB

## 2020-09-23 ENCOUNTER — Emergency Department
Admission: EM | Admit: 2020-09-23 | Discharge: 2020-09-23 | Disposition: A | Discharge status: Home / Self Care | Payer: 59 | Source: Home / Self Care | Attending: Emergency Medicine | Admitting: Emergency Medicine

## 2020-09-23 ENCOUNTER — Other Ambulatory Visit: Payer: Self-pay

## 2020-09-23 ENCOUNTER — Emergency Department: Payer: 59

## 2020-09-23 ENCOUNTER — Emergency Department
Admission: EM | Admit: 2020-09-23 | Discharge: 2020-09-23 | Disposition: A | Discharge status: Home / Self Care | Payer: 59 | Attending: Emergency Medicine | Admitting: Emergency Medicine

## 2020-09-23 ENCOUNTER — Encounter: Payer: Self-pay | Admitting: Emergency Medicine

## 2020-09-23 DIAGNOSIS — F17210 Nicotine dependence, cigarettes, uncomplicated: Secondary | ICD-10-CM | POA: Insufficient documentation

## 2020-09-23 DIAGNOSIS — R110 Nausea: Secondary | ICD-10-CM | POA: Insufficient documentation

## 2020-09-23 DIAGNOSIS — I10 Essential (primary) hypertension: Secondary | ICD-10-CM | POA: Insufficient documentation

## 2020-09-23 DIAGNOSIS — R42 Dizziness and giddiness: Secondary | ICD-10-CM | POA: Insufficient documentation

## 2020-09-23 DIAGNOSIS — F419 Anxiety disorder, unspecified: Secondary | ICD-10-CM | POA: Insufficient documentation

## 2020-09-23 DIAGNOSIS — R61 Generalized hyperhidrosis: Secondary | ICD-10-CM | POA: Diagnosis not present

## 2020-09-23 DIAGNOSIS — R0602 Shortness of breath: Secondary | ICD-10-CM | POA: Insufficient documentation

## 2020-09-23 DIAGNOSIS — R319 Hematuria, unspecified: Secondary | ICD-10-CM

## 2020-09-23 DIAGNOSIS — R002 Palpitations: Secondary | ICD-10-CM | POA: Insufficient documentation

## 2020-09-23 DIAGNOSIS — Z79899 Other long term (current) drug therapy: Secondary | ICD-10-CM | POA: Insufficient documentation

## 2020-09-23 DIAGNOSIS — N390 Urinary tract infection, site not specified: Secondary | ICD-10-CM | POA: Insufficient documentation

## 2020-09-23 LAB — BASIC METABOLIC PANEL
Anion gap: 11 (ref 5–15)
Anion gap: 13 (ref 5–15)
BUN: 15 mg/dL (ref 6–20)
BUN: 10 mg/dL (ref 6–20)
CO2: 23 mmol/L (ref 22–32)
CO2: 21 mmol/L — ABNORMAL LOW (ref 22–32)
Calcium: 8.5 mg/dL — ABNORMAL LOW (ref 8.9–10.3)
Calcium: 8.4 mg/dL — ABNORMAL LOW (ref 8.9–10.3)
Chloride: 104 mmol/L (ref 98–111)
Chloride: 106 mmol/L (ref 98–111)
Creatinine, Ser: 0.95 mg/dL (ref 0.44–1.00)
Creatinine, Ser: 0.8 mg/dL (ref 0.44–1.00)
GFR, Estimated: >60 mL/min (ref >60)
GFR, Estimated: >60 mL/min (ref >60)
Glucose, Bld: 165 mg/dL — ABNORMAL HIGH (ref 70–99)
Glucose, Bld: 115 mg/dL — ABNORMAL HIGH (ref 70–99)
Potassium: 2.8 mmol/L — ABNORMAL LOW (ref 3.5–5.1)
Potassium: 3.4 mmol/L — ABNORMAL LOW (ref 3.5–5.1)
Sodium: 138 mmol/L (ref 135–145)
Sodium: 140 mmol/L (ref 135–145)

## 2020-09-23 LAB — TROPONIN I (HIGH SENSITIVITY)
Troponin I (High Sensitivity): 6 ng/L (ref <18)
Troponin I (High Sensitivity): 5 ng/L (ref <18)

## 2020-09-23 LAB — CBC
HCT: 40 % (ref 36.0–46.0)
HCT: 41.8 % (ref 36.0–46.0)
Hemoglobin: 13.2 g/dL (ref 12.0–15.0)
Hemoglobin: 13.9 g/dL (ref 12.0–15.0)
MCH: 31.5 pg (ref 26.0–34.0)
MCH: 31.7 pg (ref 26.0–34.0)
MCHC: 33 g/dL (ref 30.0–36.0)
MCHC: 33.3 g/dL (ref 30.0–36.0)
MCV: 95.5 fL (ref 80.0–100.0)
MCV: 95.4 fL (ref 80.0–100.0)
Platelets: 242 10*3/uL (ref 150–400)
Platelets: 243 10*3/uL (ref 150–400)
RBC: 4.19 MIL/uL (ref 3.87–5.11)
RBC: 4.38 MIL/uL (ref 3.87–5.11)
RDW: 14.2 % (ref 11.5–15.5)
RDW: 14.4 % (ref 11.5–15.5)
WBC: 10.7 10*3/uL — ABNORMAL HIGH (ref 4.0–10.5)
WBC: 13.6 10*3/uL — ABNORMAL HIGH (ref 4.0–10.5)
nRBC: 0 % (ref 0.0–0.2)
nRBC: 0 % (ref 0.0–0.2)

## 2020-09-23 LAB — URINALYSIS, COMPLETE (UACMP) WITH MICROSCOPIC
Bacteria, UA: NONE SEEN
Bilirubin Urine: NEGATIVE
Glucose, UA: NEGATIVE mg/dL
Ketones, ur: NEGATIVE mg/dL
Nitrite: NEGATIVE
Protein, ur: NEGATIVE mg/dL
Specific Gravity, Urine: 1.024 (ref 1.005–1.030)
pH: 5 (ref 5.0–8.0)

## 2020-09-23 LAB — POC URINE PREG, ED: Preg Test, Ur: NEGATIVE

## 2020-09-23 MED ORDER — POTASSIUM CHLORIDE ER 10 MEQ PO TBCR: 10.0000 meq | EXTENDED_RELEASE_TABLET | Freq: Every day | ORAL | 11 refills | Status: AC

## 2020-09-23 MED ORDER — LORAZEPAM 1 MG PO TABS: 1.0000 mg | ORAL_TABLET | Freq: Two times a day (BID) | ORAL | 0 refills | Status: AC

## 2020-09-23 MED ORDER — CIPROFLOXACIN HCL 500 MG PO TABS
500.0000 mg | ORAL_TABLET | Freq: Once | ORAL | Status: AC
  Administered 2020-09-23: 23:00:00 500 mg via ORAL
  Filled 2020-09-23: qty 1

## 2020-09-23 MED ORDER — FOSFOMYCIN TROMETHAMINE 3 G PO PACK: 3.0000 g | PACK | Freq: Once | ORAL | Status: DC

## 2020-09-23 MED ORDER — HYDROCHLOROTHIAZIDE 12.5 MG PO CAPS: 12.5000 mg | ORAL_CAPSULE | Freq: Every day | ORAL | Status: DC

## 2020-09-23 MED ORDER — HYDROCHLOROTHIAZIDE 12.5 MG PO CAPS: 12.5000 mg | ORAL_CAPSULE | Freq: Every day | ORAL | 11 refills | Status: AC

## 2020-09-23 MED ORDER — LORAZEPAM 1 MG PO TABS
1.0000 mg | ORAL_TABLET | Freq: Once | ORAL | Status: AC
  Administered 2020-09-23: 23:00:00 1 mg via ORAL
  Filled 2020-09-23: qty 1

## 2020-09-23 MED ORDER — POTASSIUM CHLORIDE 20 MEQ PO PACK
40.0000 meq | PACK | Freq: Once | ORAL | Status: AC
  Administered 2020-09-23: 23:00:00 40 meq via ORAL
  Filled 2020-09-23: qty 2

## 2020-09-23 MED ORDER — CIPROFLOXACIN HCL 500 MG PO TABS: 500.0000 mg | ORAL_TABLET | Freq: Two times a day (BID) | ORAL | 0 refills | Status: AC

## 2020-09-23 NOTE — ED Triage Notes (Addendum)
Pt arrived via POV with reports of feeling lightheaded and dizzy for the past week, pt reports hx of vertigo.  Pt states she awoken out of her sleep with her heart racing, sweating, difficulty catching her breath.  Pt denies any pain at this time. No distress noted at this time. Pt has been drinking a pepsi.  Pt reports having some shortness of breath as well.

## 2020-09-23 NOTE — Discharge Instructions (Addendum)
It sounds like you may be having little bit of anxiety.  I will give you some Ativan to take 1 twice a day this may help.  Longer to give you just a little bit of it.  If you have further problems you can follow-up with [facility] via may be on to give you some help.  You can also return here.  I have refilled your hydrochlorothiazide 1 a day.  Also since your potassium was low at 4 written you a prescription for potassium pills 1 a day.  The hydrochlorothiazide sometimes will decrease your potassium.  I would have you follow-up with your regular doctor and a couple weeks to make sure your potassium is doing okay.  Please return here if you have any further problems.  I have given you some Cipro here in the emergency room which should help with the UTI that you have.  I will give you a prescription for some more to start tomorrow if it does not get rid of the UTI symptoms the burning when you pee in the next couple days please return here or follow-up with your regular doctor.  Please return for fever vomiting.

## 2020-09-23 NOTE — ED Notes (Signed)
Pt reports feeling dizzy with emesis today.  Pt brought in via ems from home.  Pt was seen in the er last night with similar sx.  Pt denies SI or HI.  No drug or etoh use.  Pt in hallway bed.   Pt alert  Speech clear.

## 2020-09-23 NOTE — ED Triage Notes (Signed)
First RN Note: Pt to ED via [facility] with c/o panic attack/light headed. Per EMS pt seen last night for same. Per EMS pt began vomiting after being discharged and being told K 2.8 and possible UTI.   Per EMS pt took 2 hits inhaler PTA.   158/91 87HR 100% RA CBG 90

## 2020-09-23 NOTE — ED Notes (Signed)
Report off to stephen rn 

## 2020-09-23 NOTE — ED Provider Notes (Signed)
[facility] Emergency Department Provider Note   ____________________________________________   Event Date/Time   First MD Initiated Contact with Patient 09/23/20 2201     (approximate)  I have reviewed the triage vital signs and the nursing notes.   HISTORY  Chief Complaint Weakness    HPI Rebekah Thomas is a 34 y.o. female who reports that she has had some feelings of anxiety with some nausea and lightheadedness.  She came in last night for the same but the wait was so long that she left.  She is also had a little bit of burning with urination and she has run out of her hydrochlorothiazide.  She says she is having her house remodeled and all the worked its going on is probably what is causing her anxiety.  She does not have any belly pain or chest pain or shortness of breath.  Right now she feels okay just a little anxious.  Labs yesterday showed little UTI and negative pregnancy test.  Her potassium was low yesterday.  Potassium is better today.         Past Medical History:  Diagnosis Date  . Hypertension   . Migraine     Patient Active Problem List   Diagnosis Date Noted  . Bimalleolar ankle fracture, left, closed, initial encounter 10/08/2014    Past Surgical History:  Procedure Laterality Date  . ANKLE SURGERY    . knee surgery      Prior to Admission medications   Medication Sig Start Date End Date Taking? Authorizing Provider  ciprofloxacin (CIPRO) 500 MG tablet Take 1 tablet (500 mg total) by mouth 2 (two) times daily for 10 days. 09/23/20 10/03/20  Arnaldo Natal, MD  cyclobenzaprine (FLEXERIL) 10 MG tablet Take 10 mg by mouth every 8 (eight) hours as needed. 05/18/20   [provider]  DULoxetine (CYMBALTA) 30 MG capsule Take 30 mg by mouth at bedtime. 06/03/20   [provider]  hydrochlorothiazide (HYDRODIURIL) 25 MG tablet Take 25 mg by mouth daily. 06/03/20   [provider]  hydrochlorothiazide  (MICROZIDE) 12.5 MG capsule Take 1 capsule (12.5 mg total) by mouth daily. 09/23/20 09/23/21  Arnaldo Natal, MD  HYDROcodone-acetaminophen (NORCO/VICODIN) 5-325 MG tablet Take 1 tablet by mouth at bedtime as needed. 05/18/20   [provider]  LORazepam (ATIVAN) 1 MG tablet Take 1 tablet (1 mg total) by mouth 2 (two) times daily. 09/23/20 09/23/21  Arnaldo Natal, MD  methylPREDNISolone (MEDROL DOSEPAK) 4 MG TBPK tablet Take by mouth as directed. 03/10/20   [provider]  orphenadrine (NORFLEX) 100 MG tablet Take 1 tablet (100 mg total) by mouth 2 (two) times daily. 08/23/20 08/23/21  Triplett, Rulon Eisenmenger B, FNP  potassium chloride (KLOR-CON) 10 MEQ tablet Take 1 tablet (10 mEq total) by mouth daily. 09/23/20   Arnaldo Natal, MD  promethazine (PHENERGAN) 12.5 MG tablet Take 12.5 mg by mouth every 6 (six) hours as needed. 02/18/20   [provider]    Allergies Dilaudid [hydromorphone], Eggs or egg-derived products, Penicillins, Sulfa antibiotics, Tramadol, Amoxicillin-pot clavulanate, Aspirin, Cefdinir, Cheese, Ibuprofen, Lac bovis, Magnesium citrate, Meloxicam, Prednisone, and Pregabalin  No family history on file.  Social History Social History   Tobacco Use  . Smoking status: Current Every Day Smoker    Packs/day: 0.50    Types: Cigarettes  . Smokeless tobacco: Never Used  Vaping Use  . Vaping Use: Never used  Substance Use Topics  . Alcohol use: Never  .  Drug use: Never    Review of Systems  Constitutional: No fever/chills Eyes: No visual changes. ENT: No sore throat. Cardiovascular: Denies chest pain. Respiratory: Denies shortness of breath. Gastrointestinal: No abdominal pain.  No nausea, no vomiting.  No diarrhea.  No constipation. Genitourinary:  dysuria. Musculoskeletal: Negative for back pain. Skin: Negative for rash. Neurological: Negative for headaches, focal weakness  ____________________________________________   PHYSICAL EXAM:  VITAL  SIGNS: ED Triage Vitals  Enc Vitals Group     BP 09/23/20 1832 (!) 153/84     Pulse Rate 09/23/20 1831 83     Resp 09/23/20 1831 20     Temp 09/23/20 1831 99 F (37.2 C)     Temp Source 09/23/20 1831 Oral     SpO2 09/23/20 1831 100 %     Weight 09/23/20 1832 284 lb 6.3 oz (129 kg)     Height 09/23/20 1832 5\' 5"  (1.651 m)     Head Circumference --      Peak Flow --      Pain Score 09/23/20 1831 0     Pain Loc --      Pain Edu? --      Excl. in GC? --     Constitutional: Alert and oriented. Well appearing and in no acute distress. Eyes: Conjunctivae are normal.  Head: Atraumatic. Nose: No congestion/rhinnorhea. Mouth/Throat: Mucous membranes are moist.  Oropharynx non-erythematous. Neck: No stridor.  Cardiovascular: Normal rate, regular rhythm. Grossly normal heart sounds.  Good peripheral circulation. Respiratory: Normal respiratory effort.  No retractions. Lungs CTAB. Gastrointestinal: Soft and nontender. No distention. No abdominal bruits. No CVA tenderness. Musculoskeletal: No lower extremity tenderness nor edema. Neurologic:  Normal speech and language. No gross focal neurologic deficits are appreciated. Skin:  Skin is warm, dry and intact. No rash noted.   ____________________________________________   LABS (all labs ordered are listed, but only abnormal results are displayed)  Labs Reviewed  BASIC METABOLIC PANEL - Abnormal; Notable for the following components:      Result Value   Potassium 3.4 (*)    CO2 21 (*)    Glucose, Bld 115 (*)    Calcium 8.4 (*)    All other components within normal limits  URINALYSIS, COMPLETE (UACMP) WITH MICROSCOPIC  PREGNANCY, URINE   ____________________________________________  EKG  EKG read interpreted by me shows normal sinus rhythm rate of 86 rightward axis no acute ST-T wave changes possible limb lead reversal. __Repeat EKG shows termination of right axis looks like earlier EKG.   __________________________________________  RADIOLOGY 09/25/20, personally viewed and evaluated these images (plain radiographs) as part of my medical decision making, as well as reviewing the written report by the radiologist.  ED MD interpretation chest x-ray read by radiology reviewed by me is negative  Official radiology report(s): DG Chest 2 View  Result Date: 09/23/2020 CLINICAL DATA:  Shortness of breath EXAM: CHEST - 2 VIEW COMPARISON:  08/23/2020 FINDINGS: Heart and mediastinal contours are within normal limits. No focal opacities or effusions. No acute bony abnormality. IMPRESSION: No active cardiopulmonary disease. Electronically Signed   By: 08/25/2020 M.D.   On: 09/23/2020 03:12    ____________________________________________   PROCEDURES  Procedure(s) performed (including Critical Care):  Procedures   ____________________________________________   INITIAL IMPRESSION / ASSESSMENT AND PLAN / ED COURSE    Patient has some symptoms of anxiety.  She also has some dysuria with little bit of white count and some white cells in her urine.  I will treat her with a l short course of Ativan if she needs it but I will also give her some Cipro for her UTI she is allergic to almost everything else.  I am worried that fosfomycin might not be enough since she does have a white count and a low-grade fever of 99.  She has had some vomiting is tolerating p.o. fluids here in small sips but when she tried to drink the potassium she vomited that up again.  She did not vomit any of the other medications looking through the bag of vomit.  She wants to go home.  She has some Zofran at home.  She will drink fluids in small amounts frequently and promises to return if she cannot tolerate p.o. fluids or she runs a high fever or feel sicker at all.  She understands that the UTI could get worse and make her seriously ill.           ____________________________________________   FINAL CLINICAL IMPRESSION(S) / ED DIAGNOSES  Final diagnoses:  Anxiety  Urinary tract infection with hematuria, site unspecified     ED Discharge Orders         Ordered    LORazepam (ATIVAN) 1 MG tablet  2 times daily        09/23/20 2224    potassium chloride (KLOR-CON) 10 MEQ tablet  Daily        09/23/20 2224    hydrochlorothiazide (MICROZIDE) 12.5 MG capsule  Daily        09/23/20 2224    ciprofloxacin (CIPRO) 500 MG tablet  2 times daily        09/23/20 2306          *Please note:  Rebekah Thomas was evaluated in Emergency Department on 09/23/2020 for the symptoms described in the history of present illness. She was evaluated in the context of the global COVID-19 pandemic, which necessitated consideration that the patient might be at risk for infection with the SARS-CoV-2 virus that causes COVID-19. Institutional protocols and algorithms that pertain to the evaluation of patients at risk for COVID-19 are in a state of rapid change based on information released by regulatory bodies including the CDC and federal and state organizations. These policies and algorithms were followed during the patient's care in the ED.  Some ED evaluations and interventions may be delayed as a result of limited staffing during and the pandemic.*   Note:  This document was prepared using Dragon voice recognition software and may include unintentional dictation errors.    Arnaldo Natal, MD 09/23/20 [phone]

## 2020-09-23 NOTE — ED Notes (Signed)
Repeat ekg done

## 2020-09-23 NOTE — ED Triage Notes (Signed)
Pt to ED via EMS for chief complaint of nausea vomiting and dizziness x1 week.  Denies pain Seen for same yesterday, LWBS NAD noted

## 2020-11-01 ENCOUNTER — Ambulatory Visit
Admission: EM | Admit: 2020-11-01 | Discharge: 2020-11-01 | Disposition: A | Discharge status: Home / Self Care | Payer: 59

## 2020-11-01 ENCOUNTER — Encounter: Payer: Self-pay | Admitting: Emergency Medicine

## 2020-11-01 ENCOUNTER — Ambulatory Visit (INDEPENDENT_AMBULATORY_CARE_PROVIDER_SITE_OTHER): Payer: 59

## 2020-11-01 ENCOUNTER — Other Ambulatory Visit: Payer: Self-pay

## 2020-11-01 DIAGNOSIS — S8391XA Sprain of unspecified site of right knee, initial encounter: Secondary | ICD-10-CM

## 2020-11-01 DIAGNOSIS — M25361 Other instability, right knee: Secondary | ICD-10-CM | POA: Diagnosis not present

## 2020-11-01 DIAGNOSIS — M25561 Pain in right knee: Secondary | ICD-10-CM

## 2020-11-01 MED ORDER — HYDROCODONE-ACETAMINOPHEN 5-325 MG PO TABS
2.0000 | ORAL_TABLET | Freq: Four times a day (QID) | ORAL | 0 refills | Status: AC | PRN
Start: 2020-11-01 — End: 2020-11-06

## 2020-11-01 MED ORDER — NAPROXEN 500 MG PO TABS: 500.0000 mg | ORAL_TABLET | Freq: Two times a day (BID) | ORAL | 0 refills | Status: AC

## 2020-11-01 NOTE — Discharge Instructions (Signed)
SPRAIN: X-ray normal. Exam consistent with sprain or possibly torn meniscus. Stressed avoiding painful activities . Reviewed RICE guidelines. Use medications as directed, including NSAIDs. If no NSAIDs have been prescribed for you today, you may take Aleve or Motrin over the counter. May use Tylenol in between doses of NSAIDs.  If no improvement in the next 1-2 weeks, f/u with PCP or return to our office for reexamination, and please feel free to call or return at any time for any questions or concerns you may have and we will be happy to help you!      You have a condition requiring you to follow up with Orthopedics so please call one of the following office for appointment:   [facility] [address] [place], [place] [address] Phone: [phone]  [facility] [address] [place], [place] [address] Phone: [phone]

## 2020-11-01 NOTE — ED Triage Notes (Signed)
Patient in today c/o right knee pain x 3 days. Patient states her knee "gave out" and she fell. Patient has a history of meniscus tear and has had surgery on the right knee. Patient has been wearing a knee brace, taken OTC Ibuprofen and Tylenol and alternating heat and ice. Patient states she cannot straighten her right knee.

## 2020-11-01 NOTE — ED Triage Notes (Signed)
Patient did not receive a hinged knee brace. Did not have a large enough size. Patient will continue to wear the knee brace she has until she sees ortho. Patient agreed and verbalized understanding.

## 2020-11-03 NOTE — ED Provider Notes (Signed)
[facility]    CSN: [id number] Arrival date & time: 11/01/20  1416      History   Chief Complaint Chief Complaint  Patient presents with  . Knee Pain    right    HPI Rebekah Thomas is a 35 y.o. female presenting for 3 day history of right sided knee pain. She states that her knee randomly gave out on her and she fell onto the knee. She admits to increased pain with extension of knee and flexing beyond 90 degrees. Denies numbness/tingling. States that the knee does feel weak. Patient admits to  Previous tear of the meniscus of this knee requiring surgery. She has a soft knee brace that she has been wearing. Has also been taking ibuprofen and Tylenol without relief. No other injuries, complaints or concerns.   HPI  Past Medical History:  Diagnosis Date  . Hypertension   . Migraine     Patient Active Problem List   Diagnosis Date Noted  . Bimalleolar ankle fracture, left, closed, initial encounter 10/08/2014    Past Surgical History:  Procedure Laterality Date  . ANKLE SURGERY    . knee surgery      OB History   No obstetric history on file.      Home Medications    Prior to Admission medications   Medication Sig Start Date End Date Taking? Authorizing Provider  ciprofloxacin (CIPRO) 500 MG tablet Take 500 mg by mouth 2 (two) times daily. Patient taking 1/2 bid prescribed 09/23/20. Patient has 1/2 tablet left   Yes [provider]  cyclobenzaprine (FLEXERIL) 10 MG tablet Take 10 mg by mouth every 8 (eight) hours as needed. 05/18/20  Yes [provider]  hydrochlorothiazide (MICROZIDE) 12.5 MG capsule Take 1 capsule (12.5 mg total) by mouth daily. 09/23/20 09/23/21 Yes Arnaldo Natal, MD  HYDROcodone-acetaminophen (NORCO/VICODIN) 5-325 MG tablet Take 2 tablets by mouth every 6 (six) hours as needed for up to 5 days. 11/01/20 11/06/20 Yes Shirlee Latch, PA-C  medroxyPROGESTERone (DEPO-PROVERA) 150 MG/ML injection Inject 150 mg into the  muscle every 3 (three) months.   Yes [provider]  naproxen (NAPROSYN) 500 MG tablet Take 1 tablet (500 mg total) by mouth 2 (two) times daily for 10 days. 11/01/20 11/11/20 Yes Eusebio Friendly B, PA-C  potassium chloride (KLOR-CON) 10 MEQ tablet Take 1 tablet (10 mEq total) by mouth daily. 09/23/20  Yes Arnaldo Natal, MD  promethazine (PHENERGAN) 12.5 MG tablet Take 12.5 mg by mouth every 6 (six) hours as needed. 02/18/20  Yes [provider]  DULoxetine (CYMBALTA) 30 MG capsule Take 30 mg by mouth at bedtime. 06/03/20   [provider]  hydrochlorothiazide (HYDRODIURIL) 25 MG tablet Take 25 mg by mouth daily. 06/03/20   [provider]  LORazepam (ATIVAN) 1 MG tablet Take 1 tablet (1 mg total) by mouth 2 (two) times daily. 09/23/20 09/23/21  Arnaldo Natal, MD  methylPREDNISolone (MEDROL DOSEPAK) 4 MG TBPK tablet Take by mouth as directed. 03/10/20   [provider]  orphenadrine (NORFLEX) 100 MG tablet Take 1 tablet (100 mg total) by mouth 2 (two) times daily. 08/23/20 08/23/21  Chinita Pester, FNP    Family History Family History  Problem Relation Age of Onset  . Hypertension Mother   . Osteoporosis Mother   . Heart disease Father   . Hypertension Father   . Stroke Father     Social History Social History   Tobacco Use  .  Smoking status: Current Every Day Smoker    Packs/day: 0.50    Types: Cigarettes  . Smokeless tobacco: Never Used  Vaping Use  . Vaping Use: Never used  Substance Use Topics  . Alcohol use: Never  . Drug use: Never     Allergies   Dilaudid [hydromorphone], Eggs or egg-derived products, Penicillins, Sulfa antibiotics, Tramadol, Amoxicillin-pot clavulanate, Aspirin, Cefdinir, Cheese, Ibuprofen, Lac bovis, Magnesium citrate, Meloxicam, Prednisone, and Pregabalin   Review of Systems Review of Systems  Musculoskeletal: Positive for arthralgias, gait problem and joint swelling.  Skin: Negative for color change and  rash.  Neurological: Positive for weakness. Negative for numbness.     Physical Exam Triage Vital Signs ED Triage Vitals  Enc Vitals Group     BP 11/01/20 1435 (!) 135/98     Pulse Rate 11/01/20 1435 85     Resp 11/01/20 1435 18     Temp 11/01/20 1435 98.2 F (36.8 C)     Temp Source 11/01/20 1435 Oral     SpO2 11/01/20 1435 98 %     Weight 11/01/20 1434 300 lb (136.1 kg)     Height 11/01/20 1434 5\' 5"  (1.651 m)     Head Circumference --      Peak Flow --      Pain Score 11/01/20 1434 8     Pain Loc --      Pain Edu? --      Excl. in GC? --    No data found.  Updated Vital Signs BP (!) 135/98 (BP Location: Left Arm)   Pulse 85   Temp 98.2 F (36.8 C) (Oral)   Resp 18   Ht 5\' 5"  (1.651 m)   Wt 300 lb (136.1 kg)   SpO2 98%   BMI 49.92 kg/m        Physical Exam Vitals and nursing note reviewed.  Constitutional:      General: She is not in acute distress.    Appearance: Normal appearance. She is obese. She is not ill-appearing or toxic-appearing.  HENT:     Head: Normocephalic and atraumatic.  Eyes:     General: No scleral icterus.       Right eye: No discharge.        Left eye: No discharge.     Conjunctiva/sclera: Conjunctivae normal.  Cardiovascular:     Rate and Rhythm: Normal rate and regular rhythm.     Pulses: Normal pulses.  Pulmonary:     Effort: Pulmonary effort is normal. No respiratory distress.  Musculoskeletal:     Cervical back: Neck supple.     Right knee: Swelling (mild diffuse swelling of knee compared to opposite) present. No ecchymosis. Decreased range of motion (cannot fully extend. Cannot flex beyond 90 degrees). Tenderness present over the medial joint line, lateral joint line and MCL.  Skin:    General: Skin is dry.  Neurological:     General: No focal deficit present.     Mental Status: She is alert. Mental status is at baseline.     Motor: No weakness.     Gait: Gait abnormal.  Psychiatric:        Mood and Affect: Mood normal.         Behavior: Behavior normal.        Thought Content: Thought content normal.      UC Treatments / Results  Labs (all labs ordered are listed, but only abnormal results are displayed) Labs Reviewed - No data to  display  EKG   Radiology No results found.  Procedures Procedures (including critical care time)  Medications Ordered in UC Medications - No data to display  Initial Impression / Assessment and Plan / UC Course  I have reviewed the triage vital signs and the nursing notes.  Pertinent labs & imaging results that were available during my care of the patient were reviewed by me and considered in my medical decision making (see chart for details).   X-ray performed and no evidence of fracture. Suspect sprain of knee vs ligament (MCL?) or meniscus tear. Unfortunately we do not have a stabilizing knee brace that will fit patient. Advised following up with ortho as soon as possible for re-examination. It was difficult to perform any special tests due to guarding and pain today. Supportive care today with [facility]. Sent rx for naproxen. She has history of rashes to some NSAIDs, but believe she has taken this one. Sent norco as well after reviewing controlled substance database. Advised to follow up was needed with our clinic.   Final Clinical Impressions(s) / UC Diagnoses   Final diagnoses:  Sprain of right knee, unspecified ligament, initial encounter  Acute pain of right knee     Discharge Instructions     SPRAIN: X-ray normal. Exam consistent with sprain or possibly torn meniscus. Stressed avoiding painful activities . Reviewed RICE guidelines. Use medications as directed, including NSAIDs. If no NSAIDs have been prescribed for you today, you may take Aleve or Motrin over the counter. May use Tylenol in between doses of NSAIDs.  If no improvement in the next 1-2 weeks, f/u with PCP or return to our office for reexamination, and please feel free to call or return at any  time for any questions or concerns you may have and we will be happy to help you!      You have a condition requiring you to follow up with Orthopedics so please call one of the following office for appointment:   [facility] [address] [place], [place] [address] Phone: [phone]  [facility] [address], [place], [place] [address] Phone: [phone]    ED Prescriptions    Medication Sig Dispense Auth. Provider   HYDROcodone-acetaminophen (NORCO/VICODIN) 5-325 MG tablet Take 2 tablets by mouth every 6 (six) hours as needed for up to 5 days. 10 tablet Eusebio Friendly B, PA-C   naproxen (NAPROSYN) 500 MG tablet Take 1 tablet (500 mg total) by mouth 2 (two) times daily for 10 days. 20 tablet Shirlee Latch, PA-C     I have reviewed the PDMP during this encounter.   Shirlee Latch, PA-C 11/03/20 2012

## 2021-03-25 IMAGING — CR DG KNEE COMPLETE 4+V*R*
4 series · 4 of 4 positions shown · non-contrast
Comparison: 03/24/2012

CLINICAL DATA: Right knee pain, instability

EXAM:
RIGHT KNEE - COMPLETE 4+ VIEW

[knee ap]
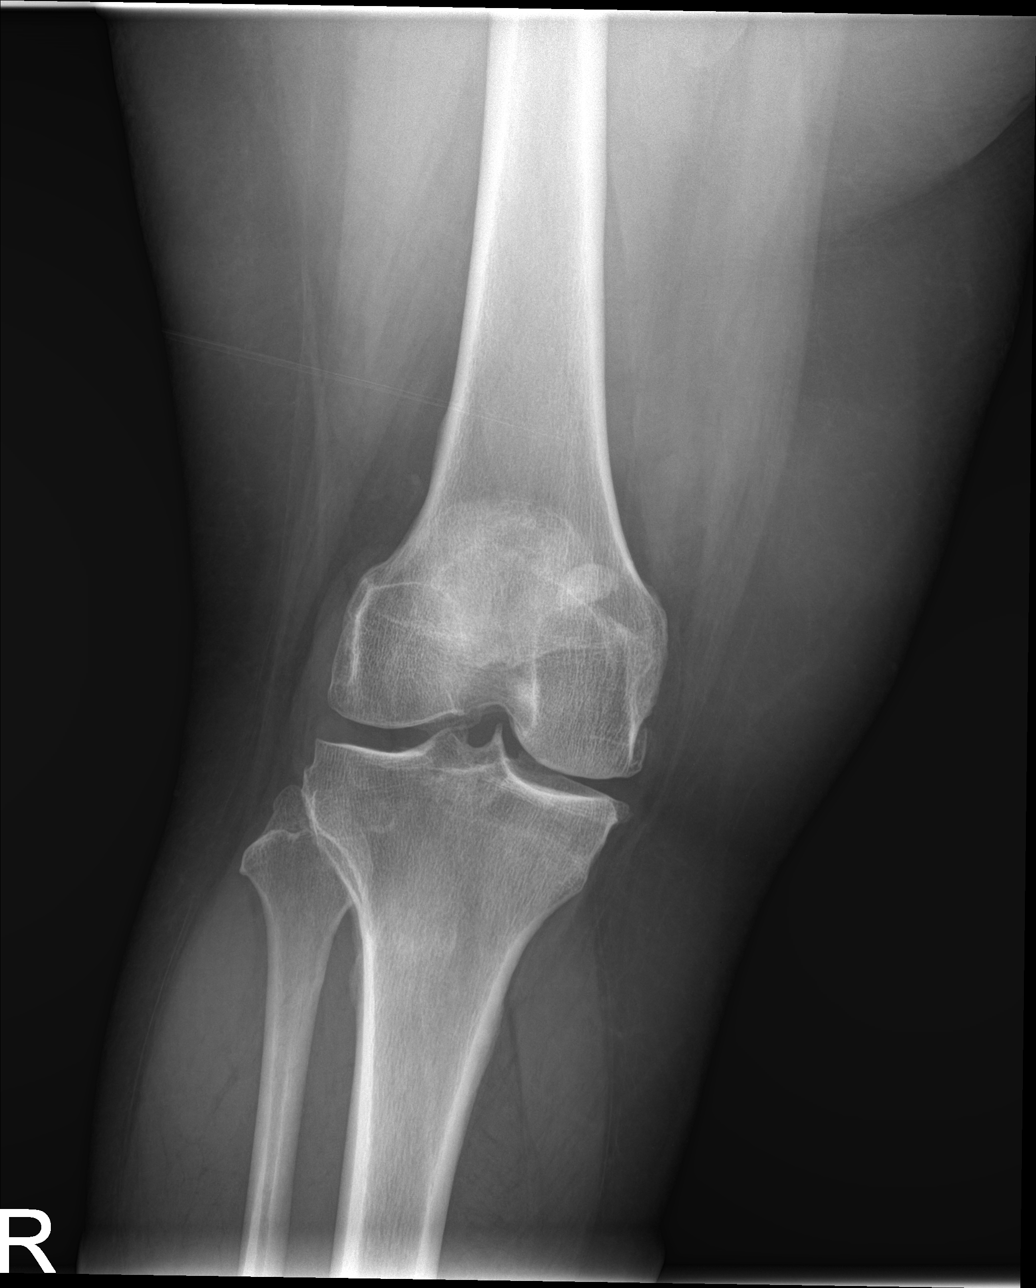

[knee lat]
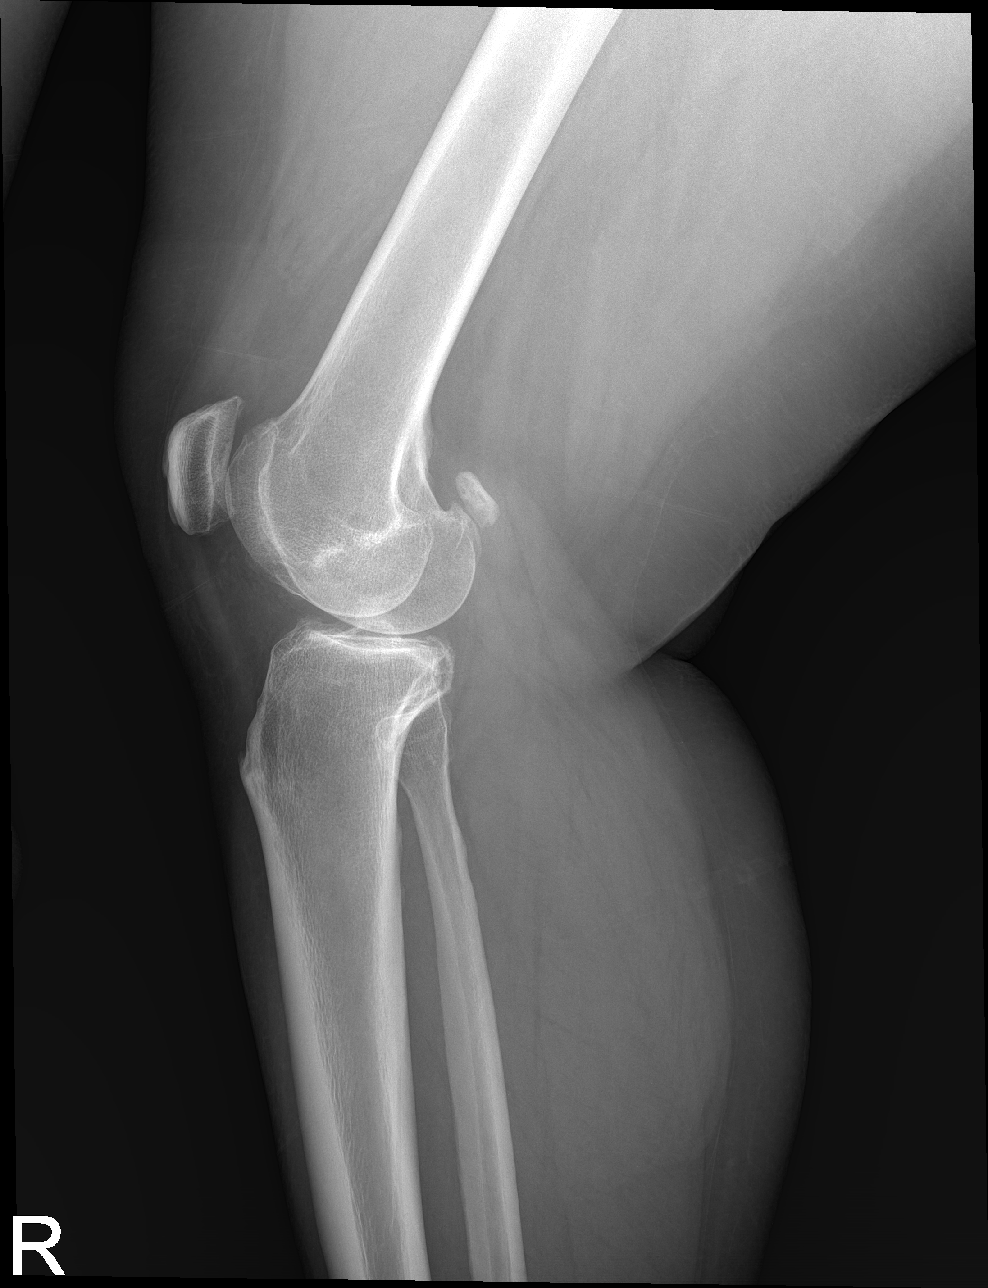

[knee obl (1 of 2)]
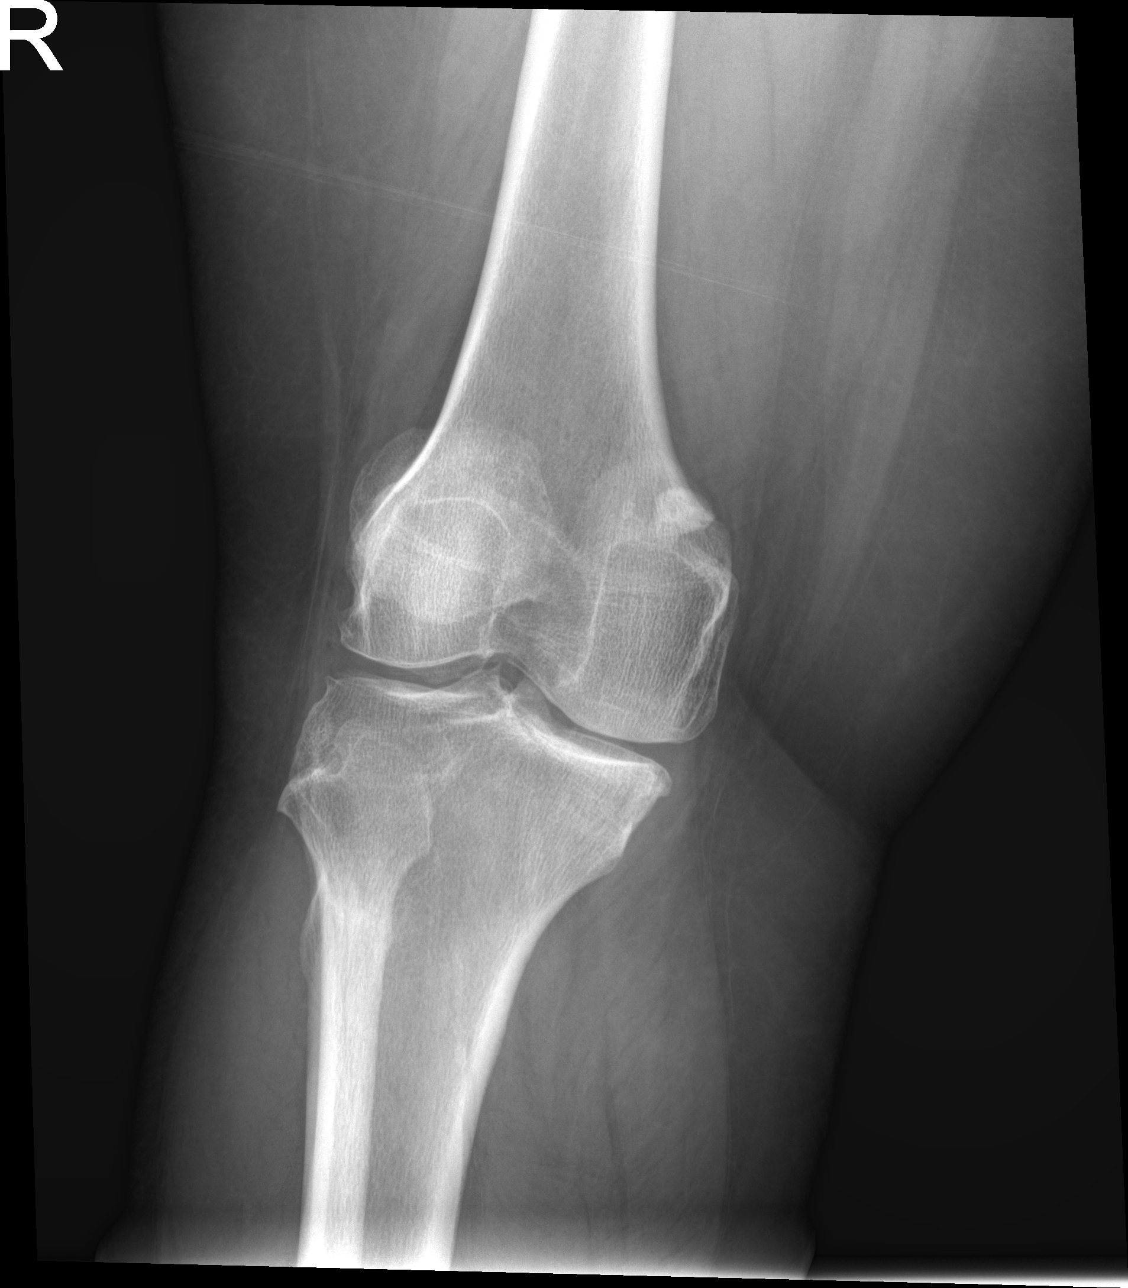

[knee obl (2 of 2)]
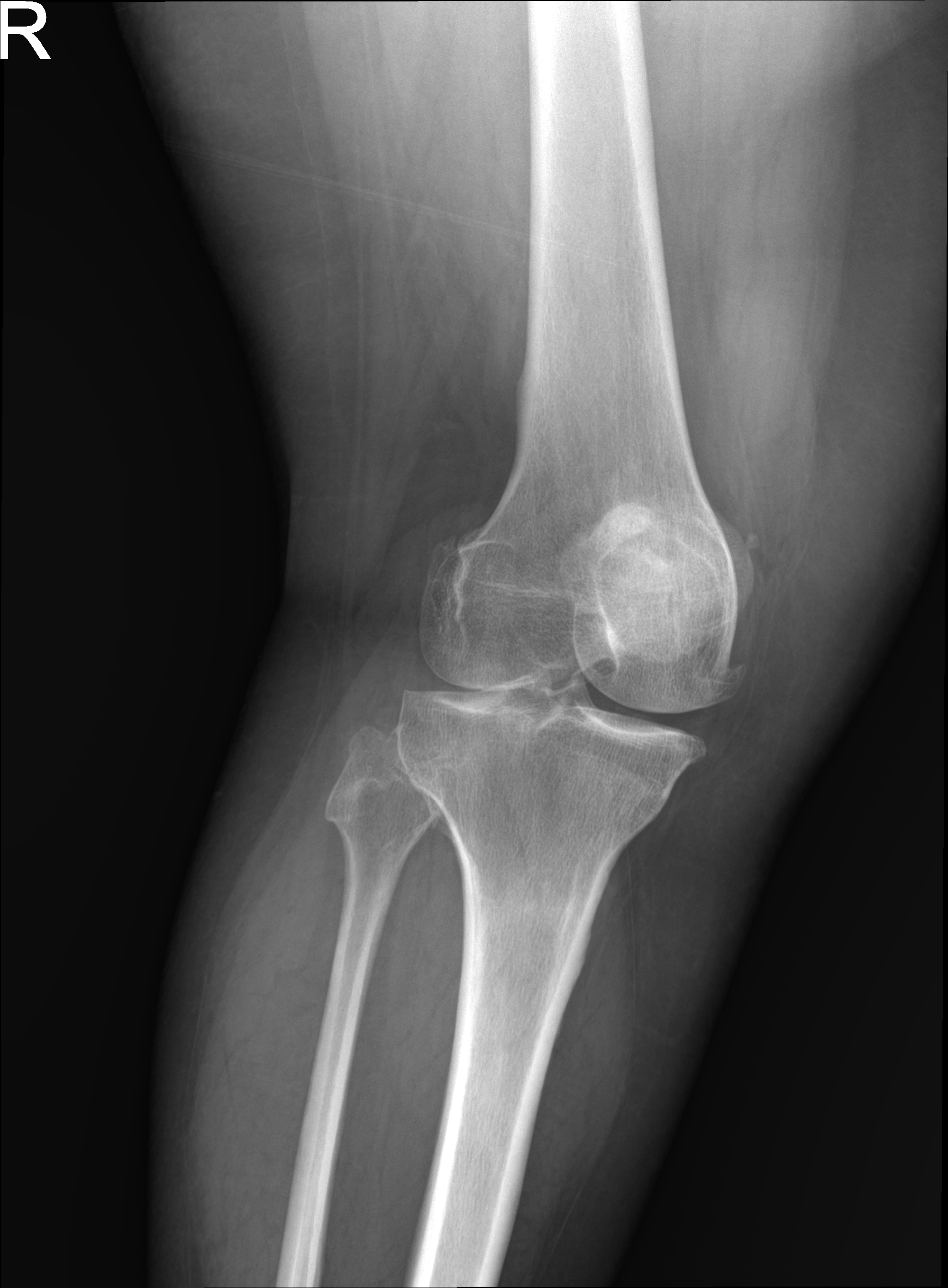

[4 of 4 positions shown; findings below may reference images not displayed]

FINDINGS: Frontal, bilateral oblique, lateral views of the right knee are
obtained. There is moderate medial compartmental osteoarthritis with
joint space narrowing and osteophyte formation. Minimal patellar
spurring. No fracture, subluxation, or dislocation. No joint
effusion. Soft tissues are unremarkable.
IMPRESSION: 1. Osteoarthritis within the patellofemoral and medial compartments.
No acute bony abnormality.

## 2021-04-22 DIAGNOSIS — J029 Acute pharyngitis, unspecified: Secondary | ICD-10-CM | POA: Diagnosis not present

## 2021-04-22 DIAGNOSIS — Z20822 Contact with and (suspected) exposure to covid-19: Secondary | ICD-10-CM | POA: Diagnosis not present

## 2021-12-21 ENCOUNTER — Other Ambulatory Visit: Payer: Self-pay

## 2021-12-21 ENCOUNTER — Emergency Department
Admission: EM | Admit: 2021-12-21 | Discharge: 2021-12-21 | Disposition: A | Discharge status: Home / Self Care | Payer: 59 | Attending: Emergency Medicine | Admitting: Emergency Medicine

## 2021-12-21 ENCOUNTER — Encounter: Payer: Self-pay | Admitting: Emergency Medicine

## 2021-12-21 DIAGNOSIS — Y9389 Activity, other specified: Secondary | ICD-10-CM | POA: Diagnosis not present

## 2021-12-21 DIAGNOSIS — M7712 Lateral epicondylitis, left elbow: Secondary | ICD-10-CM | POA: Insufficient documentation

## 2021-12-21 DIAGNOSIS — F17200 Nicotine dependence, unspecified, uncomplicated: Secondary | ICD-10-CM | POA: Insufficient documentation

## 2021-12-21 DIAGNOSIS — I10 Essential (primary) hypertension: Secondary | ICD-10-CM | POA: Insufficient documentation

## 2021-12-21 DIAGNOSIS — M79642 Pain in left hand: Secondary | ICD-10-CM | POA: Diagnosis present

## 2021-12-21 MED ORDER — METHYLPREDNISOLONE 4 MG PO TBPK: ORAL_TABLET | ORAL | 0 refills | Status: AC

## 2021-12-21 NOTE — ED Provider Notes (Signed)
? ?[facility] ?Provider Note ? ? ? None  ?  (approximate) ? ? ?History  ? ?Hand Pain ? ? ?HPI ? ?Rebekah Thomas is a 36 y.o. female   presents to the ED with complaint of left hand and wrist pain for 2 days and increased pain with lifting.  Patient denies any recent injury, swelling, past injury to this area.  Patient is right hand dominant but does a lot of lifting at work.  Patient has history of hypertension and migraines.  She continues to smoke 1/2 packs/day. ? ?  ? ? ?Physical Exam  ? ?Triage Vital Signs: ?ED Triage Vitals  ?Enc Vitals Group  ?   BP 12/21/21 0651 (!) 149/83  ?   Pulse Rate 12/21/21 0651 74  ?   Resp 12/21/21 0651 20  ?   Temp 12/21/21 0651 98.2 ?F (36.8 ?C)  ?   Temp Source 12/21/21 0651 Oral  ?   SpO2 12/21/21 0651 100 %  ?   Weight 12/21/21 0650 285 lb (129.3 kg)  ?   Height 12/21/21 0650 5\' 5"  (1.651 m)  ?   Head Circumference --   ?   Peak Flow --   ?   Pain Score 12/21/21 0650 9  ?   Pain Loc --   ?   Pain Edu? --   ?   Excl. in GC? --   ? ? ?Most recent vital signs: ?Vitals:  ? 12/21/21 0651  ?BP: (!) 149/83  ?Pulse: 74  ?Resp: 20  ?Temp: 98.2 ?F (36.8 ?C)  ?SpO2: 100%  ? ? ? ?General: Awake, no distress.  ?CV:  Good peripheral perfusion.  Heart regular rate and rhythm without murmur. ?Resp:  Normal effort.  Lungs are clear bilaterally. ?Abd:  No distention.  ?Other:  On examination of the left hand and wrist there is no deformity, erythema, warmth, soft tissue edema present.  Range of motion is slow and guarded secondary to discomfort.  There is also marked tenderness on palpation of the lateral epicondylar area which reproduces patient's pain.  Patient has difficulty making a fist due to pain located at the lateral epicondylar area.  Skin is intact. ? ? ?ED Results / Procedures / Treatments  ? ?Labs ?(all labs ordered are listed, but only abnormal results are displayed) ?Labs Reviewed - No data to display ? ? ? ? ?PROCEDURES: ? ?Critical Care performed:   ? ?Procedures ? ? ?MEDICATIONS ORDERED IN ED: ?Medications - No data to display ? ? ?IMPRESSION / MDM / ASSESSMENT AND PLAN / ED COURSE  ?I reviewed the triage vital signs and the nursing notes. ? ? ?Differential diagnosis includes, but is not limited to, left elbow sprain, left wrist strain, tendinitis, epicondylitis, arthritis, gout.. ? ?36 year old female presents to the ED with complaint of wrist and hand pain on the left but also notes that she has difficulty lifting which causes pain up closer to her elbow. This been going on for the last 2 days and not associated with any injury.  On examination of the left upper extremity there is marked tenderness on palpation of the lateral epicondylar area which reproduces patient's pain.  Patient has point tender in this area without erythema or soft tissue edema.  No deformity is noted of the left hand and wrist.  No warmth or redness to suggest gouty arthritis.  Patient was instructed to obtain a tennis elbow splint and is aware that this is more of a Velcro  band that she will place in this area.  Because she is allergic to meloxicam, prednisone and ibuprofen we discussed steroids and patient is agreeable to try methylprednisolone.  Limited use of her left hand and also the orthopedist on-call, Dr. Okey Dupre was listed on her discharge papers that if she is not improving this is the next person that she should make an appointment with. ? ? ? ?  ? ? ?FINAL CLINICAL IMPRESSION(S) / ED DIAGNOSES  ? ?Final diagnoses:  ?Left lateral epicondylitis  ? ? ? ?Rx / DC Orders  ? ?ED Discharge Orders   ? ?      Ordered  ?  methylPREDNISolone (MEDROL DOSEPAK) 4 MG TBPK tablet       ? 12/21/21 0803  ? ?  ?  ? ?  ? ? ? ?Note:  This document was prepared using Dragon voice recognition software and may include unintentional dictation errors. ?  ?Tommi Rumps, PA-C ?12/21/21 8850 ? ?  ?Jene Every, MD ?12/21/21 0831 ? ?

## 2021-12-21 NOTE — ED Notes (Signed)
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

## 2021-12-21 NOTE — Discharge Instructions (Addendum)
Follow-up with your primary care provider if any continued problems.  Obtain a tennis elbow splint which is a wide Velcro band that we will go across your arm as we discussed.  This will help with pain especially when you are lifting.  A prescription for prednisone was sent to the pharmacy as you are allergic to the NSAIDs.  Take the prednisone as directed as a tapered dose starting with 6 tablets today and decreasing by 1 tablet each day until you are finished.  Occasionally it may be necessary for the orthopedist to inject this with steroids.  The name of the orthopedist on-call is listed on your discharge papers. ?

## 2021-12-21 NOTE — ED Triage Notes (Signed)
Patient ambulatory to triage with steady gait, without difficulty or distress noted; pt reports pain & swelling to left hand/wrist x 2 days with no known injury ?

## 2022-03-15 DIAGNOSIS — Z7189 Other specified counseling: Secondary | ICD-10-CM | POA: Diagnosis not present

## 2022-03-15 DIAGNOSIS — Z0131 Encounter for examination of blood pressure with abnormal findings: Secondary | ICD-10-CM | POA: Diagnosis not present

## 2022-03-15 DIAGNOSIS — Z1389 Encounter for screening for other disorder: Secondary | ICD-10-CM | POA: Diagnosis not present

## 2022-03-15 DIAGNOSIS — Z3200 Encounter for pregnancy test, result unknown: Secondary | ICD-10-CM | POA: Diagnosis not present

## 2022-04-03 DIAGNOSIS — M5410 Radiculopathy, site unspecified: Secondary | ICD-10-CM | POA: Diagnosis not present

## 2022-04-03 DIAGNOSIS — S46811A Strain of other muscles, fascia and tendons at shoulder and upper arm level, right arm, initial encounter: Secondary | ICD-10-CM | POA: Diagnosis not present

## 2022-04-06 ENCOUNTER — Emergency Department
Admission: EM | Admit: 2022-04-06 | Discharge: 2022-04-06 | Disposition: A | Discharge status: Home / Self Care | Payer: 59 | Attending: Emergency Medicine | Admitting: Emergency Medicine

## 2022-04-06 ENCOUNTER — Other Ambulatory Visit: Payer: Self-pay

## 2022-04-06 DIAGNOSIS — M7989 Other specified soft tissue disorders: Secondary | ICD-10-CM | POA: Diagnosis not present

## 2022-04-06 DIAGNOSIS — M79601 Pain in right arm: Secondary | ICD-10-CM | POA: Diagnosis not present

## 2022-04-06 DIAGNOSIS — E876 Hypokalemia: Secondary | ICD-10-CM | POA: Diagnosis not present

## 2022-04-06 DIAGNOSIS — S46811D Strain of other muscles, fascia and tendons at shoulder and upper arm level, right arm, subsequent encounter: Secondary | ICD-10-CM | POA: Diagnosis not present

## 2022-04-06 DIAGNOSIS — R42 Dizziness and giddiness: Secondary | ICD-10-CM

## 2022-04-06 DIAGNOSIS — D72829 Elevated white blood cell count, unspecified: Secondary | ICD-10-CM | POA: Insufficient documentation

## 2022-04-06 DIAGNOSIS — X58XXXD Exposure to other specified factors, subsequent encounter: Secondary | ICD-10-CM | POA: Insufficient documentation

## 2022-04-06 DIAGNOSIS — M25511 Pain in right shoulder: Secondary | ICD-10-CM | POA: Diagnosis not present

## 2022-04-06 DIAGNOSIS — I10 Essential (primary) hypertension: Secondary | ICD-10-CM | POA: Insufficient documentation

## 2022-04-06 LAB — CBC
HCT: 46.4 % — ABNORMAL HIGH (ref 36.0–46.0)
Hemoglobin: 14.8 g/dL (ref 12.0–15.0)
MCH: 30.7 pg (ref 26.0–34.0)
MCHC: 31.9 g/dL (ref 30.0–36.0)
MCV: 96.3 fL (ref 80.0–100.0)
Platelets: 235 10*3/uL (ref 150–400)
RBC: 4.82 MIL/uL (ref 3.87–5.11)
RDW: 13.4 % (ref 11.5–15.5)
WBC: 12 10*3/uL — ABNORMAL HIGH (ref 4.0–10.5)
nRBC: 0 % (ref 0.0–0.2)

## 2022-04-06 LAB — POC URINE PREG, ED: Preg Test, Ur: NEGATIVE

## 2022-04-06 LAB — BASIC METABOLIC PANEL
Anion gap: 11 (ref 5–15)
BUN: 15 mg/dL (ref 6–20)
CO2: 23 mmol/L (ref 22–32)
Calcium: 8.8 mg/dL — ABNORMAL LOW (ref 8.9–10.3)
Chloride: 107 mmol/L (ref 98–111)
Creatinine, Ser: 1 mg/dL (ref 0.44–1.00)
GFR, Estimated: >60 mL/min (ref >60)
Glucose, Bld: 111 mg/dL — ABNORMAL HIGH (ref 70–99)
Potassium: 3.3 mmol/L — ABNORMAL LOW (ref 3.5–5.1)
Sodium: 141 mmol/L (ref 135–145)

## 2022-04-06 MED ORDER — POTASSIUM CHLORIDE CRYS ER 20 MEQ PO TBCR
40.0000 meq | EXTENDED_RELEASE_TABLET | Freq: Once | ORAL | Status: AC
  Administered 2022-04-06: 40 meq via ORAL
  Filled 2022-04-06: qty 2

## 2022-04-06 MED ORDER — MECLIZINE HCL 25 MG PO TABS: 25.0000 mg | ORAL_TABLET | Freq: Three times a day (TID) | ORAL | 0 refills | Status: AC | PRN

## 2022-04-06 NOTE — ED Provider Notes (Signed)
[facility] Provider Note    Event Date/Time   First MD Initiated Contact with Patient 04/06/22 1036     (approximate)   History   Dizziness and Hypertension   HPI  Rebekah Thomas is a 36 y.o. female who has been experiencing a feeling of a lightheadedness for the last few days and also noticed when she turns her head side to side that she feels dizzy.  She reports she has a history of vertigo that felt the same, but she also feels just a little bit lightheaded.  She takes medicine for blood pressure and checks her blood pressure at home and usually 120-130 systolic but the last day or so its been reading higher more like 140 or 150.  No chest pain.  No shortness of breath.  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  She went to urgent care and was started on prednisone and meloxicam.  No fevers or chills.  No headache.  No nausea or vomiting.  Has 1 day of prednisone taper remaining     Physical Exam   Triage Vital Signs: ED Triage Vitals  Enc Vitals Group     BP 04/06/22 1003 (!) 142/103     Pulse Rate 04/06/22 1003 78     Resp 04/06/22 1003 18     Temp 04/06/22 1003 98.7 F (37.1 C)     Temp Source 04/06/22 1003 Oral     SpO2 04/06/22 1003 100 %     Weight 04/06/22 1004 (!) 303 lb (137.4 kg)     Height 04/06/22 1004 5\' 5"  (1.651 m)     Head Circumference --      Peak Flow --      Pain Score 04/06/22 1003 9     Pain Loc --      Pain Edu? --      Excl. in GC? --     Most recent vital signs: Vitals:   04/06/22 1003 04/06/22 1103  BP: (!) 142/103 133/85  Pulse: 78 65  Resp: 18 (!) 27  Temp: 98.7 F (37.1 C)   SpO2: 100% 100%     General: Awake, no distress.  CV:  Good peripheral perfusion.  Normal heart tones Resp:  Normal effort.  Clear bilateral Abd:  No distention.  Other:  Normal cranial nerve exam.  Normal  HINTS exam (no evidence of stroke or central vertigo).  Moves all extremities with normal strength.  No pronator drift in any extremity.  Normal sensation across the face hands and legs bilaterally.  Fully alert clear speech.  On exam patient with abduction of the shoulder and lifting of the right arm reports a pinching or slightly sharp sensation along her right trapezius region.  She is tender to examination over the right trapezius muscles and slight along the right paraspinous cervical spine.  No central tenderness over the cervical or thoracic vertebrae.  no skin lesions.  Normal use of the right hand, normal motor movements right hand, warm well perfused.  No deformities.  Patient reports no trauma but pain started while lifting a child  Quickly turning the patient's head side to side does cause her to feel a spinning sensation.   ED Results / Procedures / Treatments   Labs (all labs ordered are listed, but only abnormal results are displayed) Labs Reviewed  BASIC METABOLIC PANEL -  Abnormal; Notable for the following components:      Result Value   Potassium 3.3 (*)    Glucose, Bld 111 (*)    Calcium 8.8 (*)    All other components within normal limits  CBC - Abnormal; Notable for the following components:   WBC 12.0 (*)    HCT 46.4 (*)    All other components within normal limits  POC URINE PREG, ED     EKG  ED ECG REPORT I, Sharyn Creamer, the attending physician, personally viewed and interpreted this ECG.  Date: 04/06/2022 EKG Time: 1010 Rate: 75 Rhythm: normal sinus rhythm QRS Axis: Incomplete right bundle branch Intervals: normal ST/T Wave abnormalities: normal with exception to incomplete right bundle branch block Narrative Interpretation: no evidence of acute ischemia    RADIOLOGY    PROCEDURES:  Critical Care performed: No  Procedures   MEDICATIONS ORDERED IN ED: Medications  potassium chloride SA (KLOR-CON M) CR tablet 40 mEq (has no administration  in time range)     IMPRESSION / MDM / ASSESSMENT AND PLAN / ED COURSE  I reviewed the triage vital signs and the nursing notes.                              Differential diagnosis includes, but is not limited to, possible steroid side effect With use of prednisone may be causing her some feelings of lightheadedness and some hypertension.  She reports checking her blood pressures regularly at home and typically runs 1 20-1 30 every day except the last few days for which she has been on prednisone.  Also, vertigo-like sensation.  No evidence of central neurologic cause.  Very reassuring examination consistent with peripheral vertigo.  Will treat with meclizine has history of same in past she reports the same feelings and symptoms.  Musculoskeletal right upper upper neck and trapezius pain.  Likely induced from moving shoulder and lifting children as part of her regular job when the pain started.  She has been treated for that with medications from urgent care, and reports symptoms are improving.  No clinical signs or symptoms to suggest of ACS pneumothorax pneumonia acute infectious etiology, herniated nucleus pulposus, central neurologic abnormality stroke etc.  Patient's presentation is most consistent with acute complicated illness / injury requiring diagnostic workup.  The patient is on the cardiac monitor to evaluate for evidence of arrhythmia and/or significant heart rate changes.  ----------------------------------------- 12:00 PM on 04/06/2022 ----------------------------------------- Patient work-up reassuring.  Mild leukocytosis, suspect secondary likely to steroid.  No clear infectious symptoms.  Discussed with patient, recommendations for close follow-up with her outpatient provider.  I also suspect that her blood pressure and symptoms of lightheadedness may improve as she tapers off steroid.  Patient understands not to drive while taking meclizine.  Will write out of work for 2  days as well for recovery musculoskeletal right upper extremity.  Return precautions and treatment recommendations and follow-up discussed with the patient who is agreeable with the plan.  Patient fully awake alert in no distress at this time.      FINAL CLINICAL IMPRESSION(S) / ED DIAGNOSES   Final diagnoses:  Vertigo  Hypokalemia  Primary hypertension  Trapezius strain, right, subsequent encounter     Rx / DC Orders   ED Discharge Orders          Ordered    meclizine (ANTIVERT) 25 MG tablet  3 times daily PRN  04/06/22 1051             Note:  This document was prepared using Dragon voice recognition software and may include unintentional dictation errors.   Sharyn Creamer, MD 04/06/22 1201

## 2022-04-06 NOTE — ED Triage Notes (Signed)
Pt here with dizziness and hypertension x4 days. Pt states she is on bp medicine and takes it regularly but she cannot get her pressure to come down. Pt denies injury. Pt also having neck and shoulder pain.

## 2022-04-06 NOTE — Discharge Instructions (Addendum)
We believe your symptoms were caused by benign vertigo.  Please read through the included information and take any prescribed medication(s).  Follow up with your doctor as listed above.  If you develop any new or worsening symptoms that concern you, including but not limited to persistent dizziness/vertigo, numbness or weakness in your arms or legs, altered mental status, persistent vomiting, or fever greater than 101, please return immediately to the Emergency Department.  

## 2022-04-06 NOTE — ED Notes (Signed)
D/C and reasons to return to ED discussed with pt, pt verbalized understanding. NAD noted.Pt ambulatory on D/C.  

## 2022-05-19 DIAGNOSIS — Z0131 Encounter for examination of blood pressure with abnormal findings: Secondary | ICD-10-CM | POA: Diagnosis not present

## 2022-05-19 DIAGNOSIS — Z1389 Encounter for screening for other disorder: Secondary | ICD-10-CM | POA: Diagnosis not present

## 2022-05-19 DIAGNOSIS — G5603 Carpal tunnel syndrome, bilateral upper limbs: Secondary | ICD-10-CM | POA: Diagnosis not present

## 2022-05-19 DIAGNOSIS — K219 Gastro-esophageal reflux disease without esophagitis: Secondary | ICD-10-CM | POA: Diagnosis not present

## 2022-05-19 DIAGNOSIS — M5416 Radiculopathy, lumbar region: Secondary | ICD-10-CM | POA: Diagnosis not present

## 2022-05-29 DIAGNOSIS — S83412A Sprain of medial collateral ligament of left knee, initial encounter: Secondary | ICD-10-CM | POA: Diagnosis not present

## 2022-05-29 DIAGNOSIS — Z6841 Body Mass Index (BMI) 40.0 and over, adult: Secondary | ICD-10-CM | POA: Diagnosis not present

## 2022-05-30 DIAGNOSIS — M25562 Pain in left knee: Secondary | ICD-10-CM | POA: Diagnosis not present

## 2022-05-30 DIAGNOSIS — M1712 Unilateral primary osteoarthritis, left knee: Secondary | ICD-10-CM | POA: Diagnosis not present

## 2022-07-09 DIAGNOSIS — M25562 Pain in left knee: Secondary | ICD-10-CM | POA: Diagnosis not present

## 2022-07-09 DIAGNOSIS — M25362 Other instability, left knee: Secondary | ICD-10-CM | POA: Diagnosis not present

## 2022-07-15 DIAGNOSIS — M25362 Other instability, left knee: Secondary | ICD-10-CM | POA: Diagnosis not present

## 2022-07-27 DIAGNOSIS — M25569 Pain in unspecified knee: Secondary | ICD-10-CM | POA: Diagnosis not present

## 2022-07-28 ENCOUNTER — Emergency Department: Payer: 59

## 2022-07-28 ENCOUNTER — Encounter: Payer: Self-pay | Admitting: Emergency Medicine

## 2022-07-28 ENCOUNTER — Other Ambulatory Visit: Payer: Self-pay

## 2022-07-28 ENCOUNTER — Emergency Department
Admission: EM | Admit: 2022-07-28 | Discharge: 2022-07-28 | Disposition: A | Discharge status: Home / Self Care | Payer: 59 | Attending: Emergency Medicine | Admitting: Emergency Medicine

## 2022-07-28 DIAGNOSIS — M1612 Unilateral primary osteoarthritis, left hip: Secondary | ICD-10-CM | POA: Diagnosis not present

## 2022-07-28 DIAGNOSIS — M25562 Pain in left knee: Secondary | ICD-10-CM | POA: Insufficient documentation

## 2022-07-28 DIAGNOSIS — M25552 Pain in left hip: Secondary | ICD-10-CM | POA: Diagnosis not present

## 2022-07-28 DIAGNOSIS — M1712 Unilateral primary osteoarthritis, left knee: Secondary | ICD-10-CM | POA: Diagnosis not present

## 2022-07-28 LAB — POC URINE PREG, ED: Preg Test, Ur: NEGATIVE

## 2022-07-28 MED ORDER — OXYCODONE HCL 5 MG PO TABS
5.0000 mg | ORAL_TABLET | Freq: Once | ORAL | Status: AC
  Administered 2022-07-28: 2.5 mg via ORAL
  Filled 2022-07-28: qty 1

## 2022-07-28 NOTE — ED Notes (Signed)
Pt requested to try only half tablet of pain med stating it usually makes her sick on her stomach. Explained can order zofran with this med or request doc look into another option. Pt reports had zofran before and it didn't stop the nausea. Pt states would like to try 2.5 mg and see if it helps with pain but doesn't cause nausea.

## 2022-07-28 NOTE — ED Notes (Signed)
Pt attempted to sign for d/c paperwork and education but topaz not available.

## 2022-07-28 NOTE — ED Notes (Signed)
Other half of pain med tablet wasted in pyxis with 2nd RN.

## 2022-07-28 NOTE — ED Triage Notes (Signed)
Pt reports twisting her knee at work on Monday after it "gave out" on her while standing.  Pt states now it hurts even worse and pain is radiating from her left knee to left hip and also to her back.

## 2022-07-28 NOTE — ED Notes (Addendum)
See triage note. Pt reports chronic issues with knee and had sudden weakness in it the other day at work causing her to have to slowly catch herself though denies fall or landing on knee; states twisted L knee which caused increased pain. 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 Pt in NAD.

## 2022-07-28 NOTE — ED Provider Triage Note (Signed)
Emergency Medicine Provider Triage Evaluation Note  Rebekah Thomas , a 36 y.o. female  was evaluated in triage.  Pt complains of left hip and left knee pain.  She is being treated by [facility] for left knee osteoarthritis.  Has been given a brace for knee instability and arthritis but the brace is constantly sliding down her left leg.  She has been taking NSAIDs as prescribed by [facility].  She states Monday prior left knee gave way at work and she developed pain and swelling.  Also complaining of left lateral hip pain no fevers, chills.  Review of Systems  Positive: Left knee, left hip pain Negative: Fevers, chills  Physical Exam  BP (!) 146/113 (BP Location: Left Arm)   Pulse 87   Temp 98.3 F (36.8 C) (Oral)   Resp 18   SpO2 96%  Gen:   Awake, no distress presents in a wheelchair Resp:  Normal effort  MSK:   Moves extremities without difficulty.  Leg lengths are equal.  Normal internal/external rotation of left hip with no discomfort.  No joint effusion within the left knee.  Extensor mechanism is intact Other:    Medical Decision Making  Medically screening exam initiated at 5:00 PM.  Appropriate orders placed.  Rebekah Thomas was informed that the remainder of the evaluation will be completed by another provider, this initial triage assessment does not replace that evaluation, and the importance of remaining in the ED until their evaluation is complete.     Duanne Guess, PA-C 07/28/22 1703

## 2022-07-28 NOTE — Discharge Instructions (Signed)
You can take Tylenol 1 g every 8 hours with your naproxen but you need to follow-up with orthopedic surgery to decide if you need an MRI or surgery for possible ligament or meniscus injury.  Return to the ER for worsening symptoms or any other concerns

## 2022-07-28 NOTE — ED Notes (Addendum)
Knee brace applied by NT. Crutches given and sized by NT. Pt educated.

## 2022-07-28 NOTE — ED Provider Notes (Signed)
[facility] Provider Note    Event Date/Time   First MD Initiated Contact with Patient 07/28/22 1739     (approximate)   History   Knee Pain   HPI  Rebekah Thomas is a 36 y.o. female who comes in for left hip pain and left knee pain.  Patient is being treated at [facility] for left knee osteoarthritis.  She was given a brace for her knee for instability.  She reports twisting her knee at work on Monday which which was causing her worsening pain in the left knee. Did not fall on it but twisted it.   Physical Exam   Triage Vital Signs: ED Triage Vitals  Enc Vitals Group     BP 07/28/22 1657 (!) 146/113     Pulse Rate 07/28/22 1657 87     Resp 07/28/22 1657 18     Temp 07/28/22 1657 98.3 F (36.8 C)     Temp Source 07/28/22 1657 Oral     SpO2 07/28/22 1657 96 %     Weight --      Height --      Head Circumference --      Peak Flow --      Pain Score 07/28/22 1713 10     Pain Loc --      Pain Edu? --      Excl. in [place]? --     Most recent vital signs: Vitals:   07/28/22 1657  BP: (!) 146/113  Pulse: 87  Resp: 18  Temp: 98.3 F (36.8 C)  SpO2: 96%     General: Awake, no distress.  CV:  Good peripheral perfusion.  Resp:  Normal effort.  Abd:  No distention.  Other:  Left knee pain.  Patient has pain on the medial meniscus area.  There is no warmth or obvious effusion noted.  There is no redness or bruising.  She is able to slightly flex and slightly extend the knee but somewhat limited secondary to pain.  She is got good distal pulse no calf tenderness.  No swelling the leg.  Able to flex and extend the ankle.  She is able to lift her leg up off the bed and swing her leg over for me to evaluate it but reports difficulty bearing weight due to the pain in the knee. No left hip pain with palpation    ED Results / Procedures / Treatments   Labs (all labs ordered are listed, but only abnormal results are displayed) Labs Reviewed  POC  URINE PREG, ED     RADIOLOGY I have reviewed the xray personally and interpreted no evidence of fracture   IMPRESSION: No acute findings. Mild arthritic changes of the hips and left knee.   PROCEDURES:  Critical Care performed: No  Procedures   MEDICATIONS ORDERED IN ED: Medications - No data to display   IMPRESSION / MDM / [place] / ED COURSE  I reviewed the triage vital signs and the nursing notes.   Patient's presentation is most consistent with acute, uncomplicated illness.   Differential includes ligament injury, meniscus injury.  No evidence of septic joint good distal pulse no evidence of DVT based upon examination.  X-rays ordered  Pregnancy test was negative.  X-ray ordered to evaluate for fracture, dislocation.  This was reassuring to shows arthritic changes.  She is already on naproxen.  We will give 1 dose of oxycodone, crutches and hinged knee brace to help support her  and she will go to [facility] for follow-up to discuss MRI to rule out ligament or meniscus injuries.    FINAL CLINICAL IMPRESSION(S) / ED DIAGNOSES   Final diagnoses:  Acute pain of left knee     Rx / DC Orders   ED Discharge Orders     None        Note:  This document was prepared using Dragon voice recognition software and may include unintentional dictation errors.   Concha Se, MD 07/28/22 1843

## 2022-12-09 DIAGNOSIS — Z419 Encounter for procedure for purposes other than remedying health state, unspecified: Secondary | ICD-10-CM | POA: Diagnosis not present

## 2022-12-14 DIAGNOSIS — J4520 Mild intermittent asthma, uncomplicated: Secondary | ICD-10-CM | POA: Diagnosis not present

## 2022-12-14 DIAGNOSIS — R519 Headache, unspecified: Secondary | ICD-10-CM | POA: Diagnosis not present

## 2022-12-14 DIAGNOSIS — Z20822 Contact with and (suspected) exposure to covid-19: Secondary | ICD-10-CM | POA: Diagnosis not present

## 2022-12-14 DIAGNOSIS — B349 Viral infection, unspecified: Secondary | ICD-10-CM | POA: Diagnosis not present

## 2022-12-15 ENCOUNTER — Ambulatory Visit: Payer: Medicaid Other | Admitting: Physician Assistant

## 2023-01-09 DIAGNOSIS — Z419 Encounter for procedure for purposes other than remedying health state, unspecified: Secondary | ICD-10-CM | POA: Diagnosis not present

## 2023-02-08 DIAGNOSIS — Z419 Encounter for procedure for purposes other than remedying health state, unspecified: Secondary | ICD-10-CM | POA: Diagnosis not present

## 2023-03-11 DIAGNOSIS — Z419 Encounter for procedure for purposes other than remedying health state, unspecified: Secondary | ICD-10-CM | POA: Diagnosis not present

## 2023-04-10 DIAGNOSIS — Z419 Encounter for procedure for purposes other than remedying health state, unspecified: Secondary | ICD-10-CM | POA: Diagnosis not present

## 2023-05-11 DIAGNOSIS — Z419 Encounter for procedure for purposes other than remedying health state, unspecified: Secondary | ICD-10-CM | POA: Diagnosis not present

## 2023-05-24 DIAGNOSIS — Z3200 Encounter for pregnancy test, result unknown: Secondary | ICD-10-CM | POA: Diagnosis not present

## 2023-05-24 DIAGNOSIS — Z1331 Encounter for screening for depression: Secondary | ICD-10-CM | POA: Diagnosis not present

## 2023-05-24 DIAGNOSIS — N920 Excessive and frequent menstruation with regular cycle: Secondary | ICD-10-CM | POA: Diagnosis not present

## 2023-05-24 DIAGNOSIS — Z1389 Encounter for screening for other disorder: Secondary | ICD-10-CM | POA: Diagnosis not present

## 2023-05-24 DIAGNOSIS — Z0131 Encounter for examination of blood pressure with abnormal findings: Secondary | ICD-10-CM | POA: Diagnosis not present

## 2023-05-24 DIAGNOSIS — Z0130 Encounter for examination of blood pressure without abnormal findings: Secondary | ICD-10-CM | POA: Diagnosis not present

## 2024-02-06 ENCOUNTER — Emergency Department
Admission: EM | Admit: 2024-02-06 | Discharge: 2024-02-07 | Disposition: A | Discharge status: Home / Self Care | Attending: Emergency Medicine | Admitting: Emergency Medicine

## 2024-02-06 ENCOUNTER — Other Ambulatory Visit: Payer: Self-pay

## 2024-02-06 DIAGNOSIS — M549 Dorsalgia, unspecified: Secondary | ICD-10-CM | POA: Diagnosis present

## 2024-02-06 DIAGNOSIS — M5442 Lumbago with sciatica, left side: Secondary | ICD-10-CM | POA: Insufficient documentation

## 2024-02-06 DIAGNOSIS — D72829 Elevated white blood cell count, unspecified: Secondary | ICD-10-CM | POA: Diagnosis not present

## 2024-02-06 DIAGNOSIS — R748 Abnormal levels of other serum enzymes: Secondary | ICD-10-CM | POA: Insufficient documentation

## 2024-02-06 DIAGNOSIS — M5441 Lumbago with sciatica, right side: Secondary | ICD-10-CM | POA: Diagnosis not present

## 2024-02-06 DIAGNOSIS — I10 Essential (primary) hypertension: Secondary | ICD-10-CM | POA: Diagnosis not present

## 2024-02-06 DIAGNOSIS — M5440 Lumbago with sciatica, unspecified side: Secondary | ICD-10-CM

## 2024-02-06 LAB — CBC WITH DIFFERENTIAL/PLATELET
Abs Immature Granulocytes: 0.05 10*3/uL (ref 0.00–0.07)
Basophils Absolute: 0.1 10*3/uL (ref 0.0–0.1)
Basophils Relative: 0 %
Eosinophils Absolute: 0.1 10*3/uL (ref 0.0–0.5)
Eosinophils Relative: 1 %
HCT: 40.7 % (ref 36.0–46.0)
Hemoglobin: 13.6 g/dL (ref 12.0–15.0)
Immature Granulocytes: 0 %
Lymphocytes Relative: 34 %
Lymphs Abs: 4.7 10*3/uL — ABNORMAL HIGH (ref 0.7–4.0)
MCH: 31.5 pg (ref 26.0–34.0)
MCHC: 33.4 g/dL (ref 30.0–36.0)
MCV: 94.2 fL (ref 80.0–100.0)
Monocytes Absolute: 0.7 10*3/uL (ref 0.1–1.0)
Monocytes Relative: 5 %
Neutro Abs: 8.2 10*3/uL — ABNORMAL HIGH (ref 1.7–7.7)
Neutrophils Relative %: 60 %
Platelets: 217 10*3/uL (ref 150–400)
RBC: 4.32 MIL/uL (ref 3.87–5.11)
RDW: 14 % (ref 11.5–15.5)
WBC: 13.8 10*3/uL — ABNORMAL HIGH (ref 4.0–10.5)
nRBC: 0 % (ref 0.0–0.2)

## 2024-02-06 LAB — URINALYSIS, ROUTINE W REFLEX MICROSCOPIC
Bilirubin Urine: NEGATIVE
Glucose, UA: NEGATIVE mg/dL
Hgb urine dipstick: NEGATIVE
Ketones, ur: NEGATIVE mg/dL
Leukocytes,Ua: NEGATIVE
Nitrite: NEGATIVE
Protein, ur: NEGATIVE mg/dL
Specific Gravity, Urine: 1.009 (ref 1.005–1.030)
pH: 7 (ref 5.0–8.0)

## 2024-02-06 MED ORDER — SODIUM CHLORIDE 0.9 % IV BOLUS
1000.0000 mL | Freq: Once | INTRAVENOUS | Status: AC
  Administered 2024-02-06: 1000 mL via INTRAVENOUS

## 2024-02-06 MED ORDER — ONDANSETRON HCL 4 MG/2ML IJ SOLN
4.0000 mg | Freq: Once | INTRAMUSCULAR | Status: AC
  Administered 2024-02-06: 4 mg via INTRAVENOUS
  Filled 2024-02-06: qty 2

## 2024-02-06 MED ORDER — MORPHINE SULFATE (PF) 4 MG/ML IV SOLN
4.0000 mg | Freq: Once | INTRAVENOUS | Status: AC
  Administered 2024-02-06: 4 mg via INTRAVENOUS
  Filled 2024-02-06: qty 1

## 2024-02-06 NOTE — ED Provider Notes (Signed)
 [facility] Provider Note    Event Date/Time   First MD Initiated Contact with Patient 02/06/24 2303     (approximate)   History   Back Pain   HPI  Rebekah Thomas is a 38 y.o. female with a history of hypertension and GERD who presents with bilateral back, abdominal, and leg pain, acute onset around 7 PM while she was at work.  The patient states that the pain is bilateral but somewhat worse on the left especially in the proximal left leg.  She states it is on both sides of her back and both sides of her lower abdomen, and all the pain started around the same time.  She denies any headache, shortness of breath, cough, chest pain, fever, or chills.  She has no vomiting or diarrhea.  She denies any urinary symptoms.  She denies any numbness or tingling in the lower extremities.  She denies any specific injuries.  Viewed the past medical records.  The patient's most recent outpatient counter was in urgent care in August 2023 for knee pain.  She has no recent hospitalizations.   Physical Exam   Triage Vital Signs: ED Triage Vitals  Encounter Vitals Group     BP 02/06/24 2300 128/79     Systolic BP Percentile --      Diastolic BP Percentile --      Pulse Rate 02/06/24 2250 76     Resp 02/06/24 2250 18     Temp 02/06/24 2250 98.6 F (37 C)     Temp Source 02/06/24 2250 Oral     SpO2 02/06/24 2250 100 %     Weight 02/06/24 2249 (!) 311 lb 8.2 oz (141.3 kg)     Height 02/06/24 2249 5\' 5"  (1.651 m)     Head Circumference --      Peak Flow --      Pain Score 02/06/24 2250 9     Pain Loc --      Pain Education --      Exclude from Growth Chart --     Most recent vital signs: Vitals:   02/07/24 0330 02/07/24 0400  BP: 118/63 121/69  Pulse: 60 65  Resp: 19 (!) 25  Temp:    SpO2: 99% 100%     General: Alert, no distress.  CV:  Good peripheral perfusion.  Resp:  Normal effort.  Abd:  Soft with mild diffuse discomfort.  No focal tenderness.  No  distention.  Other:  No midline spinal tenderness.  Bilateral lumbar paraspinal muscle tenderness.  5/5 motor strength and intact sensation to bilateral lower extremities although flexion of the hips and knees and raising of the legs exacerbates the back pain.  No edema.  No significant muscle tenderness to the legs.   ED Results / Procedures / Treatments   Labs (all labs ordered are listed, but only abnormal results are displayed) Labs Reviewed  COMPREHENSIVE METABOLIC PANEL WITH GFR - Abnormal; Notable for the following components:      Result Value   Potassium 3.3 (*)    CO2 21 (*)    Calcium 8.4 (*)    All other components within normal limits  CBC WITH DIFFERENTIAL/PLATELET - Abnormal; Notable for the following components:   WBC 13.8 (*)    Neutro Abs 8.2 (*)    Lymphs Abs 4.7 (*)    All other components within normal limits  URINALYSIS, ROUTINE W REFLEX MICROSCOPIC - Abnormal; Notable for the following components:  Color, Urine YELLOW (*)    APPearance CLEAR (*)    All other components within normal limits  CK - Abnormal; Notable for the following components:   Total CK 273 (*)    All other components within normal limits  RESP PANEL BY RT-PCR (RSV, FLU A&B, COVID)  RVPGX2  LACTIC ACID, PLASMA  LIPASE, BLOOD  URINE DRUG SCREEN, QUALITATIVE ([facility] ONLY)  PREGNANCY, URINE     EKG  ED ECG REPORT I, Lind Repine, the attending physician, personally viewed and interpreted this ECG.  Date: 02/06/2024 EKG Time: 2256 Rate: 74 Rhythm: normal sinus rhythm QRS Axis: Right axis Intervals: normal ST/T Wave abnormalities: Nonspecific T wave abnormalities Narrative Interpretation: no evidence of acute ischemia    RADIOLOGY  CT abdomen/pelvis: I independently viewed and interpreted the images; there are no dilated bowel loops or any free air or free fluid.  Radiology report indicates the following.  IMPRESSION:  1. Normal CT abdomen/pelvis.     PROCEDURES:  Critical Care performed: No  Procedures   MEDICATIONS ORDERED IN ED: Medications  sodium chloride  0.9 % bolus 1,000 mL (0 mLs Intravenous Stopped 02/07/24 0006)  ondansetron  (ZOFRAN ) injection 4 mg (4 mg Intravenous Given 02/06/24 2331)  morphine (PF) 4 MG/ML injection 4 mg (4 mg Intravenous Given 02/06/24 2331)  iohexol (OMNIPAQUE) 350 MG/ML injection 100 mL (100 mLs Intravenous Contrast Given 02/07/24 0144)     IMPRESSION / MDM / ASSESSMENT AND PLAN / ED COURSE  I reviewed the triage vital signs and the nursing notes.  38 year old female with PMH as noted above presents with acute bilateral, abdominal, and leg pain although with no weakness or numbness.  On exam her vital signs are normal.  Neurologic exam is nonfocal although slightly limited by pain; the patient has pain with flexion of the hips and knees.  Differential diagnosis includes, but is not limited to, myalgias related to a viral syndrome such as flu or COVID, electrolyte abnormality, lumbar strain, other muscle strain or spasm, lumbar radiculopathy, sciatica, UTI/pyelonephritis, less likely intra-abdominal etiology.  We will obtain lab workup, CT abdomen/pelvis, give a fluid bolus, analgesia, and reassess.  Patient's presentation is most consistent with acute complicated illness / injury requiring diagnostic workup.  ----------------------------------------- 4:20 AM on 02/07/2024 -----------------------------------------  CT is negative for acute findings.  Lab workup is significant for mild leukocytosis.  CK is also minimally elevated, although not really consistent with rhabdomyolysis.  Respiratory panel is negative.  Urine is clear.  Other labs are unremarkable.  The patient states that her symptoms are significantly improved.  We have observed her last few hours as she was getting the fluid bolus.  Overall the differential remains the same; I suspect either radiculopathy with some sciatica, lumbar  muscle strain or spasm, or possible myalgias related to a viral infection.  The patient is stable for discharge home at this time.  I counseled her on the results of the workup and plan of care.  Since she is allergic to NSAIDs and tramadol , I have prescribed a very small quantity of hydrocodone  after reviewing her record and the PDMP.   FINAL CLINICAL IMPRESSION(S) / ED DIAGNOSES   Final diagnoses:  Acute bilateral low back pain with sciatica, sciatica laterality unspecified     Rx / DC Orders   ED Discharge Orders          Ordered    HYDROcodone -acetaminophen  (NORCO/VICODIN) 5-325 MG tablet  Every 6 hours PRN        02/07/24 0418  Note:  This document was prepared using Dragon voice recognition software and may include unintentional dictation errors.    Lind Repine, MD 02/07/24 [phone]

## 2024-02-06 NOTE — ED Triage Notes (Signed)
 Pt arrives via ems with c/o abd pain, bilateral leg pain, and lower back pain. Pt reports this pain began acutely and ems told writer she urinated herself without knowing. Pt is A&Ox4. Ems administered 75mcg of fentanyl en route as well as 4mg  of zofran .

## 2024-02-07 ENCOUNTER — Emergency Department

## 2024-02-07 LAB — COMPREHENSIVE METABOLIC PANEL WITH GFR
ALT: 16 U/L (ref 0–44)
AST: 19 U/L (ref 15–41)
Albumin: 3.8 g/dL (ref 3.5–5.0)
Alkaline Phosphatase: 69 U/L (ref 38–126)
Anion gap: 8 (ref 5–15)
BUN: 11 mg/dL (ref 6–20)
CO2: 21 mmol/L — ABNORMAL LOW (ref 22–32)
Calcium: 8.4 mg/dL — ABNORMAL LOW (ref 8.9–10.3)
Chloride: 110 mmol/L (ref 98–111)
Creatinine, Ser: 0.75 mg/dL (ref 0.44–1.00)
GFR, Estimated: >60 mL/min (ref >60)
Glucose, Bld: 97 mg/dL (ref 70–99)
Potassium: 3.3 mmol/L — ABNORMAL LOW (ref 3.5–5.1)
Sodium: 139 mmol/L (ref 135–145)
Total Bilirubin: 0.6 mg/dL (ref 0.0–1.2)
Total Protein: 6.9 g/dL (ref 6.5–8.1)

## 2024-02-07 LAB — URINE DRUG SCREEN, QUALITATIVE (ARMC ONLY)
Amphetamines, Ur Screen: NOT DETECTED
Barbiturates, Ur Screen: NOT DETECTED
Benzodiazepine, Ur Scrn: NOT DETECTED
Cannabinoid 50 Ng, Ur ~~LOC~~: NOT DETECTED
Cocaine Metabolite,Ur ~~LOC~~: NOT DETECTED
MDMA (Ecstasy)Ur Screen: NOT DETECTED
Methadone Scn, Ur: NOT DETECTED
Opiate, Ur Screen: NOT DETECTED
Phencyclidine (PCP) Ur S: NOT DETECTED
Tricyclic, Ur Screen: NOT DETECTED

## 2024-02-07 LAB — RESP PANEL BY RT-PCR (RSV, FLU A&B, COVID)  RVPGX2
Influenza A by PCR: NEGATIVE
Influenza B by PCR: NEGATIVE
Resp Syncytial Virus by PCR: NEGATIVE
SARS Coronavirus 2 by RT PCR: NEGATIVE

## 2024-02-07 LAB — PREGNANCY, URINE: Preg Test, Ur: NEGATIVE

## 2024-02-07 LAB — CK: Total CK: 273 U/L — ABNORMAL HIGH (ref 38–234)

## 2024-02-07 LAB — LACTIC ACID, PLASMA: Lactic Acid, Venous: 1.3 mmol/L (ref 0.5–1.9)

## 2024-02-07 LAB — LIPASE, BLOOD: Lipase: 28 U/L (ref 11–51)

## 2024-02-07 MED ORDER — IOHEXOL 350 MG/ML SOLN
100.0000 mL | Freq: Once | INTRAVENOUS | Status: AC | PRN
  Administered 2024-02-07: 100 mL via INTRAVENOUS

## 2024-02-07 MED ORDER — HYDROCODONE-ACETAMINOPHEN 5-325 MG PO TABS: 1.0000 | ORAL_TABLET | Freq: Four times a day (QID) | ORAL | 0 refills | Status: AC | PRN

## 2024-02-07 NOTE — ED Notes (Signed)
 Lab called to add on urine pregnancy.

## 2024-02-07 NOTE — Discharge Instructions (Addendum)
 Your symptoms could be caused by a muscle strain or spasm, body aches due to a viral infection, or nerve pain from sciatica.  You may take the hydrocodone  as needed over the next couple of days, however you should switch to over-the-counter Tylenol  as soon as you are able to.  Make sure to drink plenty of fluids.  Follow-up with your primary care provider.  Return to the ER for new, worsening, or persistent severe abdominal, back, or leg pain, weakness or numbness in legs, inability to walk or bear weight, high fevers, vomiting, or any other new or worsening symptoms that concern you.

## 2024-10-17 ENCOUNTER — Other Ambulatory Visit: Payer: Self-pay | Admitting: Primary Care

## 2024-10-17 ENCOUNTER — Ambulatory Visit
Admission: RE | Admit: 2024-10-17 | Discharge: 2024-10-17 | Disposition: A | Discharge status: Home / Self Care | Source: Ambulatory Visit | Attending: Primary Care | Admitting: Primary Care

## 2024-10-17 DIAGNOSIS — M25572 Pain in left ankle and joints of left foot: Secondary | ICD-10-CM
